# Patient Record
Sex: Male | Born: 1955
Health system: Southern US, Community
[De-identification: ages and names within clinical notes are randomized; demographics above are authoritative.]

## PROBLEM LIST (undated history)

## (undated) DIAGNOSIS — F329 Major depressive disorder, single episode, unspecified: Secondary | ICD-10-CM

## (undated) DIAGNOSIS — D72819 Decreased white blood cell count, unspecified: Secondary | ICD-10-CM

## (undated) DIAGNOSIS — R05 Cough: Secondary | ICD-10-CM

## (undated) DIAGNOSIS — E7800 Pure hypercholesterolemia, unspecified: Secondary | ICD-10-CM

## (undated) DIAGNOSIS — R9431 Abnormal electrocardiogram [ECG] [EKG]: Secondary | ICD-10-CM

## (undated) DIAGNOSIS — I10 Essential (primary) hypertension: Secondary | ICD-10-CM

## (undated) HISTORY — DX: Decreased white blood cell count, unspecified: D72.819

## (undated) HISTORY — PX: COLONOSCOPY: SHX174

## (undated) HISTORY — DX: Cough: R05

## (undated) HISTORY — DX: Abnormal electrocardiogram (ECG) (EKG): R94.31

## (undated) HISTORY — DX: Major depressive disorder, single episode, unspecified: F32.9

## (undated) HISTORY — DX: Essential (primary) hypertension: I10

## (undated) HISTORY — DX: Pure hypercholesterolemia, unspecified: E78.00

---

## 2001-10-04 HISTORY — PX: OTHER SURGICAL HISTORY: SHX169

## 2004-02-01 ENCOUNTER — Emergency Department (HOSPITAL_COMMUNITY): Admission: EM | Admit: 2004-02-01 | Discharge: 2004-02-02 | Payer: Self-pay | Admitting: Emergency Medicine

## 2004-02-02 ENCOUNTER — Ambulatory Visit: Payer: Self-pay | Admitting: Psychiatry

## 2004-02-02 ENCOUNTER — Inpatient Hospital Stay (HOSPITAL_COMMUNITY): Admission: EM | Admit: 2004-02-02 | Discharge: 2004-02-06 | Payer: Self-pay | Admitting: Psychiatry

## 2004-03-22 ENCOUNTER — Emergency Department (HOSPITAL_COMMUNITY): Admission: EM | Admit: 2004-03-22 | Discharge: 2004-03-22 | Payer: Self-pay | Admitting: Emergency Medicine

## 2004-03-22 ENCOUNTER — Inpatient Hospital Stay (HOSPITAL_COMMUNITY): Admission: RE | Admit: 2004-03-22 | Discharge: 2004-03-27 | Payer: Self-pay | Admitting: Psychiatry

## 2004-03-22 ENCOUNTER — Ambulatory Visit: Payer: Self-pay | Admitting: Psychiatry

## 2005-04-10 ENCOUNTER — Ambulatory Visit: Payer: Self-pay | Admitting: Endocrinology

## 2005-04-17 ENCOUNTER — Emergency Department (HOSPITAL_COMMUNITY): Admission: EM | Admit: 2005-04-17 | Discharge: 2005-04-17 | Payer: Self-pay | Admitting: Emergency Medicine

## 2005-04-17 ENCOUNTER — Inpatient Hospital Stay (HOSPITAL_COMMUNITY): Admission: RE | Admit: 2005-04-17 | Discharge: 2005-04-21 | Payer: Self-pay | Admitting: Psychiatry

## 2005-04-18 ENCOUNTER — Ambulatory Visit: Payer: Self-pay | Admitting: Psychiatry

## 2005-04-24 ENCOUNTER — Ambulatory Visit: Payer: Self-pay | Admitting: Endocrinology

## 2006-02-24 ENCOUNTER — Ambulatory Visit: Payer: Self-pay | Admitting: Endocrinology

## 2006-11-12 ENCOUNTER — Encounter: Payer: Self-pay | Admitting: Endocrinology

## 2006-11-12 DIAGNOSIS — F329 Major depressive disorder, single episode, unspecified: Secondary | ICD-10-CM

## 2006-11-12 DIAGNOSIS — I10 Essential (primary) hypertension: Secondary | ICD-10-CM

## 2006-11-12 DIAGNOSIS — F3289 Other specified depressive episodes: Secondary | ICD-10-CM | POA: Insufficient documentation

## 2006-11-12 HISTORY — DX: Major depressive disorder, single episode, unspecified: F32.9

## 2006-11-12 HISTORY — DX: Essential (primary) hypertension: I10

## 2008-03-06 ENCOUNTER — Ambulatory Visit: Payer: Self-pay | Admitting: Endocrinology

## 2008-03-06 LAB — CONVERTED CEMR LAB
ALT: 34 units/L (ref 0–53)
AST: 30 units/L (ref 0–37)
Alkaline Phosphatase: 60 units/L (ref 39–117)
Basophils Absolute: 0 10*3/uL (ref 0.0–0.1)
Bilirubin Urine: NEGATIVE
Bilirubin, Direct: 0.1 mg/dL (ref 0.0–0.3)
CO2: 30 meq/L (ref 19–32)
Calcium: 9.5 mg/dL (ref 8.4–10.5)
Chloride: 107 meq/L (ref 96–112)
Glucose, Bld: 101 mg/dL — ABNORMAL HIGH (ref 70–99)
HDL: 53.1 mg/dL (ref >39.0)
Hemoglobin: 13.3 g/dL (ref 13.0–17.0)
Ketones, ur: NEGATIVE mg/dL
LDL Cholesterol: 129 mg/dL — ABNORMAL HIGH (ref 0–99)
Leukocytes, UA: NEGATIVE
Lymphocytes Relative: 41.6 % (ref 12.0–46.0)
MCHC: 32.6 g/dL (ref 30.0–36.0)
Monocytes Relative: 10 % (ref 3.0–12.0)
Neutro Abs: 2.1 10*3/uL (ref 1.4–7.7)
Neutrophils Relative %: 45.6 % (ref 43.0–77.0)
Nitrite: NEGATIVE
Platelets: 257 10*3/uL (ref 150–400)
Potassium: 3.9 meq/L (ref 3.5–5.1)
RDW: 14.4 % (ref 11.5–14.6)
Sodium: 143 meq/L (ref 135–145)
TSH: 0.85 microintl units/mL (ref 0.35–5.50)
Total Bilirubin: 0.7 mg/dL (ref 0.3–1.2)
Total CHOL/HDL Ratio: 3.6
Total Protein: 7.5 g/dL (ref 6.0–8.3)
Urobilinogen, UA: 0.2 (ref 0.0–1.0)
pH: 6 (ref 5.0–8.0)

## 2008-03-10 ENCOUNTER — Ambulatory Visit: Payer: Self-pay | Admitting: Endocrinology

## 2008-03-10 DIAGNOSIS — R9431 Abnormal electrocardiogram [ECG] [EKG]: Secondary | ICD-10-CM | POA: Insufficient documentation

## 2008-03-10 DIAGNOSIS — E119 Type 2 diabetes mellitus without complications: Secondary | ICD-10-CM | POA: Insufficient documentation

## 2008-03-10 DIAGNOSIS — D72819 Decreased white blood cell count, unspecified: Secondary | ICD-10-CM | POA: Insufficient documentation

## 2008-03-10 DIAGNOSIS — E7800 Pure hypercholesterolemia, unspecified: Secondary | ICD-10-CM

## 2008-03-10 HISTORY — DX: Abnormal electrocardiogram (ECG) (EKG): R94.31

## 2008-03-10 HISTORY — DX: Pure hypercholesterolemia, unspecified: E78.00

## 2008-03-10 HISTORY — DX: Decreased white blood cell count, unspecified: D72.819

## 2008-03-15 ENCOUNTER — Ambulatory Visit: Payer: Self-pay

## 2008-03-15 ENCOUNTER — Encounter: Payer: Self-pay | Admitting: Endocrinology

## 2008-03-16 ENCOUNTER — Telehealth: Payer: Self-pay | Admitting: Endocrinology

## 2008-09-25 ENCOUNTER — Ambulatory Visit: Payer: Self-pay | Admitting: Endocrinology

## 2008-09-25 DIAGNOSIS — R059 Cough, unspecified: Secondary | ICD-10-CM | POA: Insufficient documentation

## 2008-09-25 DIAGNOSIS — R05 Cough: Secondary | ICD-10-CM

## 2008-09-25 HISTORY — DX: Cough, unspecified: R05.9

## 2009-09-04 ENCOUNTER — Emergency Department (HOSPITAL_COMMUNITY): Admission: EM | Admit: 2009-09-04 | Discharge: 2009-09-04 | Payer: Self-pay | Admitting: Family Medicine

## 2009-10-16 ENCOUNTER — Ambulatory Visit: Payer: Self-pay | Admitting: Endocrinology

## 2009-10-16 LAB — CONVERTED CEMR LAB
ALT: 33 units/L (ref 0–53)
AST: 31 units/L (ref 0–37)
BUN: 14 mg/dL (ref 6–23)
Basophils Relative: 0.8 % (ref 0.0–3.0)
Bilirubin, Direct: 0.1 mg/dL (ref 0.0–0.3)
Chloride: 104 meq/L (ref 96–112)
Eosinophils Relative: 1.6 % (ref 0.0–5.0)
GFR calc non Af Amer: 121.16 mL/min (ref >60)
HCT: 42.7 % (ref 39.0–52.0)
HDL: 44.6 mg/dL (ref >39.00)
Leukocytes, UA: NEGATIVE
Microalb Creat Ratio: 2.1 mg/g (ref 0.0–30.0)
Monocytes Relative: 9.2 % (ref 3.0–12.0)
Neutrophils Relative %: 56.1 % (ref 43.0–77.0)
Nitrite: NEGATIVE
Platelets: 304 10*3/uL (ref 150.0–400.0)
Potassium: 3.8 meq/L (ref 3.5–5.1)
RBC: 5.78 M/uL (ref 4.22–5.81)
Specific Gravity, Urine: 1.025 (ref 1.000–1.030)
Total Bilirubin: 0.5 mg/dL (ref 0.3–1.2)
Total CHOL/HDL Ratio: 5
Total Protein: 7.2 g/dL (ref 6.0–8.3)
VLDL: 41.8 mg/dL — ABNORMAL HIGH (ref 0.0–40.0)
WBC: 5.4 10*3/uL (ref 4.5–10.5)
pH: 6 (ref 5.0–8.0)

## 2009-11-15 ENCOUNTER — Ambulatory Visit: Payer: Self-pay | Admitting: Endocrinology

## 2009-11-15 ENCOUNTER — Encounter: Payer: Self-pay | Admitting: Endocrinology

## 2009-11-21 ENCOUNTER — Ambulatory Visit: Payer: Self-pay | Admitting: Cardiology

## 2010-05-07 NOTE — Assessment & Plan Note (Signed)
Summary: 1 MTH PHYSICAL---STC   Vital Signs:  Patient profile:   55 year old male Height:      66 inches (167.64 cm) Weight:      187.50 pounds (85.23 kg) BMI:     30.37 O2 Sat:      97 % on Room air Temp:     98.2 degrees F (36.78 degrees C) oral Pulse rate:   82 / minute BP sitting:   138 / 90  (left arm) Cuff size:   regular  Vitals Entered By: Brenton Grills MA (November 15, 2009 9:38 AM)  O2 Flow:  Room air CC: Physical/pt is not currently taking Pravachol or using Lotrisone cream/aj Is Patient Diabetic? Yes   CC:  Physical/pt is not currently taking Pravachol or using Lotrisone cream/aj.  History of Present Illness: here for regular wellness examination.  He's feeling pretty well in general, and does not drink or smoke.   Current Medications (verified): 1)  Pravachol 40 Mg Tabs (Pravastatin Sodium) .... 2 Tabs At Bedtime 2)  Lotrisone 1-0.05 % Crea (Clotrimazole-Betamethasone) .... Three Times A Day As Needed Rash 3)  Losartan Potassium-Hctz 100-12.5 Mg Tabs (Losartan Potassium-Hctz) .Marland Kitchen.. 1 Once Daily  Allergies (verified): 1)  ! Zestril  Family History: Reviewed history from 09/25/2008 and no changes required. 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         denies decreased urinary stream.  Physical Exam  General:  normal appearance.   Head:  head: no deformity eyes: no periorbital swelling, no proptosis external nose and ears are normal mouth: no lesion seen Neck:  Supple without thyroid enlargement or tenderness.  Lungs:  Clear  to auscultation bilaterally. Normal respiratory effort.  Abdomen:  abdomen is soft, nontender.  no hepatosplenomegaly.   not distended.  no hernia  Rectal:  normal external and internal exam.  heme neg  Prostate:  Normal size prostate without masses or tenderness.  Msk:  muscle bulk and strength are grossly normal.  no obvious joint swelling.  gait is normal and steady  Extremities:  no deformity.  no ulcer on the feet.  feet are of normal color and temp.   mild heat rash on the feet trace right pedal edema and trace left pedal edema.   Neurologic:  cn 2-12 grossly intact.   readily moves all 4's.   sensation is intact to touch on the feet  Skin:  normal texture and temp.  no rash.  not diaphoretic  Cervical Nodes:  No significant adenopathy.  Psych:  Alert and cooperative; normal mood and affect; normal attention span and concentration.   Additional Exam:  SEPARATE EVALUATION FOLLOWS--EACH PROBLEM HERE IS NEW, NOT RESPONDING TO TREATMENT, OR POSES SIGNIFICANT RISK TO THE PATIENT'S HEALTH: HISTORY OF THE PRESENT ILLNESS: ecg is more abnormal than in 2009.  no chest pain elev ldl and a1c are noted.  no weight loss PAST MEDICAL HISTORY reviewed and up to date today REVIEW OF SYSTEMS: no sob.  he reports weight gain PHYSICAL EXAMINATION: see vs page cv: reg rate and rhythm.  no murmur dorsalis pedis intact bilat.  no carotid bruit LAB/XRAY RESULTS: a1c and ldl are  reviewed ecg reviewed IMPRESSION: dm, needs increased rx dyslipidemia, needs increased rx abnormal ecg PLAN: see instruction sheet   Impression & Recommendations:  Problem # 1:  ROUTINE GENERAL MEDICAL EXAM@HEALTH  CARE FACL (ICD-V70.0)  Medications Added to Medication List This Visit: 1)  Metformin Hcl 500 Mg Xr24h-tab (Metformin hcl) .Marland Kitchen.. 1 tab each am  Other Orders: EKG w/ Interpretation (93000) Cardiology Referral (Cardiology) Est. Patient Level III (02725) Est. Patient 40-64 years (36644)  Patient  Instructions: 1)  refer cardiology.  you will be called with a day and time for an appointment. 2)  resume pravastatin 2x40 mg at bedtime. 3)  metformin 500 mg each am. 4)  good diet and exercise habits significanly improve the control of your diabetes.  please let me know if you wish to be referred to a dietician.  high blood sugar is very risky to your health.  you should see an eye doctor every year. 5)  controlling your blood pressure and cholesterol drastically reduces the damage diabetes does to your body.  this also applies to quitting smoking.  please discuss these with your doctor.  you should take an aspirin every day, unless you have been advised by a doctor not to. 6)  please consider these measures for your health:  minimize alcohol.  do not use tobacco products.  have a colonoscopy at least every 10 years from age 25.  keep firearms safely stored.  always use seat belts.  have working smoke alarms in your home.  see an eye doctor and dentist regularly.  never drive under the influence of alcohol or drugs (including prescription drugs).  7)  Please schedule a follow-up appointment in 6 months. Prescriptions: METFORMIN HCL 500 MG XR24H-TAB (METFORMIN HCL) 1 tab each am  #90 x 3   Entered and Authorized by:   Minus Breeding MD   Signed by:   Minus Breeding MD on 11/15/2009   Method used:   Electronically to        [facility] (retail)       [address]       [place], [place]  [id number]       Ph: [phone]       Fax: [phone]   RxID:   [id number] LOTRISONE 1-0.05 % CREA (CLOTRIMAZOLE-BETAMETHASONE) three times a day as needed rash  #1 med tube x 2   Entered and Authorized by:   Minus Breeding MD   Signed by:   Minus Breeding MD on 11/15/2009   Method used:   Electronically to        [facility] (retail)       [address]       [place], [place]  [id number]       Ph: [phone]       Fax: [phone]   RxID:    [id number] PRAVACHOL 40 MG TABS (PRAVASTATIN SODIUM) 2 tabs at bedtime  #180 x 3   Entered and Authorized by:   Minus Breeding MD   Signed by:   Minus Breeding MD on 11/15/2009   Method used:   Electronically to        [facility] (retail)       [address]       [place], [place]  [id number]       Ph: [phone]       Fax: [phone]   RxID:   [id number]

## 2010-05-07 NOTE — Assessment & Plan Note (Signed)
Summary: np6/abn ekg/jml   Visit Type:  new pt visit [address] [facility]:  Minus Breeding MD  CC:  pt here for Abnormal EKG as per Everardo All w/[place]...denies any cardiac complaints today.  History of Present Illness: 55 yo with history of diabetes, hyperlipidemia, and HTN presents for cardiac risk evaluation.  He has been doing well symptomatically with no chest pain/tightness or exertional dyspnea.  He is not very active.  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 He has been taking metformin for diabetes with stable fasting glucose.  LDL was 140 on recent BMET, but he had been taking his pravastatin only rarely.  He is taking it every night now.  His ECG shows LVH with repolarization abnormality.  BP is under good control today at 118/80.  He is very concerned about medical expenses as he has to pay out of pocket.   ECG: NSR, LVH with repolarization abnormality  Labs (7/11): LDL 140, HDL 45, TGs 209, K 3.8, creatinine 0.9, TSH normal  Current Medications (verified): 1)  Pravachol 40 Mg Tabs (Pravastatin Sodium) .... 2 Tabs At Bedtime 2)  Lotrisone 1-0.05 % Crea (Clotrimazole-Betamethasone) .... Three Times A Day As Needed Rash 3)  Losartan Potassium-Hctz 100-12.5 Mg Tabs (Losartan Potassium-Hctz) .Marland Kitchen.. 1 Once Daily 4)  Metformin Hcl 500 Mg Xr24h-Tab (Metformin Hcl) .Marland Kitchen.. 1 Tab Each Am  Allergies: 1)  ! Zestril  Past History:  Past Medical History: 1. Depression 2. Hypertension: Echo (12/09) with EF 65%, no valvular abnormalities 3. Dyslipidemia 4. Diabetes  Family History: Reviewed history from 09/25/2008 and no changes required. 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   Vital Signs:  Patient profile:   55 year old male Height:       66 inches Weight:      190 pounds Pulse rate:   98 / minute Pulse rhythm:   irregular BP sitting:   118 / 80  (left arm) Cuff size:   large  Vitals Entered By: Danielle Rankin, CMA (November 21, 2009 2:45 PM)  Physical Exam  General:  Well developed, well nourished, in no acute distress. Head:  normocephalic and atraumatic Nose:  no deformity, discharge, inflammation, or lesions Mouth:  Teeth, gums and palate normal. Oral mucosa normal. Neck:  Neck supple, no JVD. No masses, thyromegaly or abnormal cervical nodes. Lungs:  Clear bilaterally to auscultation and percussion. Heart:  Non-displaced PMI, chest non-tender; regular rate and rhythm, S1, S2 without murmurs, rubs. +S4. Carotid upstroke normal, no bruit. Pedals normal pulses. No edema, no varicosities. Abdomen:  Bowel sounds positive; abdomen soft and non-tender without masses, organomegaly, or hernias noted. No hepatosplenomegaly. Msk:  Back normal, normal gait. Muscle strength and tone normal. Extremities:  No clubbing or cyanosis. Neurologic:  Alert and oriented x 3. Skin:  Intact without lesions or rashes. Psych:  Normal affect.   Impression & Recommendations:  Problem # 1:  HYPERTENSION (ICD-401.9) ECG shows LVH with repolarization.  However, BP is well-controlled on current regimen.  Would continue.  Echo in 2009 showed normal LV systolic function and minimal LVH. Will not repeat as patient would have to pay out of pocket.   Problem # 2:  HYPERCHOLESTEROLEMIA (ICD-272.0) Will need repeat lipids/LFTs taking pravastatin regularly.  Goal  LDL at least < 100 with diabetes history.   Problem # 3:  CV RISK ASSESSMENT Patient has a number of risk factors for coronary disease, including DM, HTN, and hyperlipidemia.  Would continue aggressive risk factor management, as is being done.  ECG shows LVH with secondary repolarization abnormality, likely related to his HTN.  No further diagnostic evaluation necessary at this.  As above,  would keep SBP less than 130 and LDL at least < 100.   Patient Instructions: 1)  Your physician has recommended you make the following change in your medication:  2)  Taker Aspirin 81mg  daily--this should be buffered or coated. 3)  Your physician recommends that you schedule a follow-up appointment as needed with Dr Shirlee Latch.

## 2010-05-07 NOTE — Assessment & Plan Note (Signed)
Summary: bp meds/cd   Vital Signs:  Patient profile:   55 year old male Height:      66 inches (167.64 cm) Weight:      186 pounds (84.55 kg) BMI:     30.13 O2 Sat:      96 % on Room air Temp:     97.6 degrees F (36.44 degrees C) oral Pulse rate:   99 / minute BP sitting:   130 / 80  (left arm) Cuff size:   regular  Vitals Entered By: Brenton Grills MA (October 16, 2009 2:54 PM)  O2 Flow:  Room air CC: discuss BP meds/pt states he is not currently taking Pravachol and is no longer using the Lotrisone cream/aj   CC:  discuss BP meds/pt states he is not currently taking Pravachol and is no longer using the Lotrisone cream/aj.  History of Present Illness: dm:  pt says his bp is elevated (150/-).  he increased the hyzaar, with improvement in his bp.  pt states he feels well in general.   dyslipidemia:  he is not talking pravachol. dm:  pt c/o weight gain  Allergies: 1)  ! 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   Lungs:  Clear to auscultation bilaterally. Normal respiratory effort.  Heart:  Regular rate and rhythm without murmurs or gallops noted. Normal S1,S2.   Additional Exam:  Hemoglobin A1C            6.5 %   Impression & Recommendations:  Problem # 1:  HYPERCHOLESTEROLEMIA (ICD-272.0) needs increased rx  Problem # 2:  HYPERTENSION (ICD-401.9) needs increased rx  Problem # 3:  DM (ICD-250.00) rx complicated by weight gain  Medications Added to Medication List This Visit: 1)  Pravachol 40 Mg Tabs (Pravastatin sodium) .... 2 tabs at bedtime 2)  Losartan Potassium-hctz 100-12.5 Mg Tabs (Losartan potassium-hctz) .Marland Kitchen..  1 once daily  Other Orders: TLB-Lipid Panel (80061-LIPID) TLB-BMP (Basic Metabolic Panel-BMET) (80048-METABOL) TLB-CBC Platelet - w/Differential (85025-CBCD) TLB-Hepatic/Liver Function Pnl (80076-HEPATIC) TLB-TSH (Thyroid Stimulating Hormone) (84443-TSH) TLB-A1C / Hgb A1C (Glycohemoglobin) (83036-A1C) TLB-Microalbumin/Creat Ratio, Urine (82043-MALB) TLB-Udip w/ Micro (81001-URINE) Est. Patient Level IV (04540)  Patient Instructions: 1)  increase hyzaar to 100/12.5, 1/day 2)  physical in 1 month. 3)  blood tests are being ordered for you today.  please call [phone] to hear your test results. 4)  (update: i left message on phone-tree:  resume pravachol at 80 mg at bedtime.  weight loss is advised). Prescriptions: PRAVACHOL 40 MG TABS (PRAVASTATIN SODIUM) 2 tabs at bedtime  #60 x 11   Entered and Authorized by:   Minus Breeding MD   Signed by:   Minus Breeding MD on 10/17/2009   Method used:   Electronically to        [id number] (retail)       [address]       [place], [place]  [address]       Ph: [phone] or [phone]       Fax: [phone]   RxID:   [id number] LOSARTAN POTASSIUM-HCTZ 100-12.5 MG TABS (LOSARTAN POTASSIUM-HCTZ) 1 once daily  #30 x  11   Entered and Authorized by:   Minus Breeding MD   Signed by:   Minus Breeding MD on 10/16/2009   Method used:   [facility] to        [id number]       [address]       [place], [place]  [address]       Ph: [phone] or [phone]       Fax: [phone]   RxID:   [id number]

## 2010-08-23 NOTE — H&P (Signed)
[name], [name]              ACCOUNT NO.:  [id number]   MEDICAL RECORD NO.:  [id number]          PATIENT TYPE:  IPS   LOCATION:  0306                          FACILITY:  [facility]   PHYSICIAN:  Syed T. Arfeen, M.D.   DATE OF BIRTH:  [birth year]   DATE OF ADMISSION:  03/22/2004  DATE OF DISCHARGE:                         PSYCHIATRIC ADMISSION ASSESSMENT   IDENTIFYING INFORMATION:  This is a 55 year old married African-American  male who was voluntarily admitted on March 22, 2004.   HISTORY OF PRESENT ILLNESS:  The patient presents with therapy history of  depression, having suicide ideation with no specific plans.  The patient  relapsed and has been drinking beer and doing cocaine.  The patient was  recently discharged from [facility] about a month ago for  similar complaints of depression, drug and alcohol use, some apparent  noncompliance with medication.   PAST PSYCHIATRIC HISTORY:  The patient was here in November of 2005 for  depression and cocaine abuse.   SOCIAL HISTORY:  He is a 55 year old married African-American male, had s 1-  year-old son.  The patient at this time is unable to provide any other  social history.  He is quite sleepy.   FAMILY HISTORY:  Unknown.   ALCOHOL DRUG HISTORY:  The patient reports some recent alcohol and cocaine  use.   PAST MEDICAL HISTORY:  Primary care [name] is unknown.  Medical problems  are hypertension.   MEDICATIONS:  Symmetrel 100 mg b.i.d., Hyzaar 50/12.5, Ambien 10 mg q.h.s.  and Prozac 20 mg daily.   DRUG ALLERGIES:  No known allergies.   PHYSICAL EXAMINATION:  The patient was assessed at [facility].  He is at this time in the bed, no acute distress, very sleepy.  Temperature is 97.4, 88 heart rate, 20 respirations, blood pressure 130/94,  151 pounds, 5 feet 6 inches tall.   LABORATORY DATA:  Potassium was 3.4, alcohol was 19, urine drug screen was  positive for cocaine.   MENTAL  STATUS EXAM:  The patient is in the bed, very sleepy, trying to be  cooperative but he is having difficulty keeping his eyes open.  Speech is  clear when he does respond.  The patient is feeling very guilty.  Affect  again, the patient is feeling very sleepy.  Thought processes are brief  answers, what the patient provides are coherent.   ADMISSION DIAGNOSES:   AXIS I:  1.  Major depressive disorder.  2.  Rule out substance-induced mood disorder.  3.  Cocaine abuse.   AXIS II:  Deferred.   AXIS III:  Hypertension.   AXIS IV:  Other psychosocial problems related to drug and alcohol use,  potential problems with primary support group.   AXIS V:  Current is 35, past year is 55.   PLAN:  Admission for depression, suicide ideation, drug use.  Contract for  safety, stabilize mood and thinking.  We will resume his medications.  May  increase the patient's Prozac, work on relapse prevention.  The patient is  to attend all individual and group therapy, consider the CDIOP  program.  The  patient is to attend NA and AA meetings for continued support.   TENTATIVE LENGTH OF CARE:  4-5 days.     Jani   JO/MEDQ  D:  03/23/2004  T:  03/24/2004  Job:  [id number]

## 2010-08-23 NOTE — Discharge Summary (Signed)
NAMECOSME, [name]              ACCOUNT NO.:  [id number]   MEDICAL RECORD NO.:  [id number]          PATIENT TYPE:  IPS   LOCATION:  0305                          FACILITY:  [facility]   PHYSICIAN:  Syed T. Arfeen, M.D.   DATE OF BIRTH:  31-Mar-1956   DATE OF ADMISSION:  03/22/2004  DATE OF DISCHARGE:  03/27/2004                                 DISCHARGE SUMMARY   IDENTIFYING INFORMATION:  The patient is a 55 year old, married, African-  American who was voluntarily admitted on December 16th.   HISTORY OF PRESENT ILLNESS:  Patient presented with a history of depression,  having suicidal thoughts; however, no specific plan.  Patient relapsed and  has been drinking beer and doing cocaine.  Patient was recently discharged  from [facility] about a month ago for similar complaints of  depression, drug and alcohol use; however, has been noncompliant with his  medication.   PAST PSYCHIATRIC HISTORY:  Patient was here in November of 2005 for  depression and cocaine abuse.   MEDICATIONS:  1.  Symmetrel 100 mg twice a day.  2.  Hyzaar 50/12.5.  3.  Ambien 10 mg q.h.s.  4.  Prozac 20 mg daily.   DRUG ALLERGIES:  No known drug allergies.   PAST MEDICAL HISTORY:  No primary care Erinn Huskins.  However, history of  hypertension.   PHYSICAL EXAMINATION:  Physical examination was done at [facility]; essentially within normal limits.   LABORATORY DATA:  Potassium was 3.4, alcohol was 19.  Urine drug screen was  positive for cocaine.   MENTAL STATUS EXAM:  Patient was lying in the bed, very sleepy, trying to be  cooperative, but having difficulty keeping his eyes open.  His speech was  slow, clear, but coherent.  Patient appears very depressed, isolated and  withdrawn.  Feels very guilty about his relapse.  Mood sad.  Affect  dysphoric.  Thought processes were logical and goal-directed, but appears  very sedated.  Denies any auditory hallucinations or homicidal ideation,  but  endorses to vague suicidal thoughts.  Insight and judgment limited.   ADMITTING DIAGNOSIS:   AXIS I:  1.  Major depressive disorder.  2.  Rule out substance-induced mood disorder.  3.  Cocaine abuse.  4.  Alcohol abuse.   AXIS II:  Deferred.   AXIS III:  Hypertension, as per history.   AXIS IV:  Moderate.   AXIS V:  35.   HOSPITAL COURSE:  The patient was admitted and ordered routine p.r.n. and  standing medication.  He was placed for safety checks.  His medications were  resumed and he was closely monitored for any substance abuse withdrawal,  intoxication and started on Librium 2 mg p.o. q.6 p.r.n. for any possible  withdrawals.  He was restarted on his medication, Prozac, which was  gradually increased from 30 mg.  He was encouraged to participate in  individual, group and milieu therapies.  He was also started on Symmetrel  100 mg twice a day and losartan 50 mg for his blood pressure and  hydrochlorothiazide 12.5 every day antihypertensive medicine.  Patient  continues to endorse some depression and passive suicidal thoughts.  His  Prozac was increased to 40 mg a day, which patient tolerated very well and  started to see some positive improvements.  No shakes, tremors or any  withdrawal symptom was noted.  He was encouraged to verbalize in the group.  A family session was requested and done with the wife, in which patient was  able to address about his issues and admitted that he was being noncompliant  with his medications.  The wife appears to be very supportive, but very  concerned about his noncompliance with his medications.  Patient was able to  address this issue and agreed to comply with the aftercare and discharge  plan.  He started showing some improvement, seemed more verbal, active and  social in the groups, able to participate in the groups.  He reported a  positive response to medication intervention and increased Prozac.  Discharge planning was  discussed and patient appears to be very motivated  and excited about his follow-up and aftercare plan.  He endorses that he  would like to go back to his wife and agreed that he would be compliant with  the medication.   CONDITION ON DISCHARGE:  Remarkably improved.  Mood euthymic.  Affect  Yam.  Thought processes logical, goal-directed.  Thought content negative  for any dangerousness or suicidal ideation.  No paranoia, delusion elicited.  Denies any auditory hallucination, suicidal ideation or homicidal ideation.  Seen with increased coping and social skills.  Reported a positive response  to medication intervention.   DISCHARGE MEDICATIONS:  1.  Amantadine 100 mg twice a day.  2.  Prozac 40 mg once a day.  3.  Losartan 50 mg every day.  4.  Hydrochlorothiazide 12.5 mg every day.   DISPOSITION:  Patient was discharged with a follow-up to see Dr. Tomasa Rand  on Thursday, January 5th, at 10:45 a.m.  He was given an appointment for  both one-to-one and individual therapies and medication management.  Patient  was told to see Tenna Child on January 6th on Friday at 10:15 a.m.,  address is [facility], [address] [place].   DISCHARGE DIAGNOSES:   AXIS I:  1.  Depressive disorder, not otherwise specified.  2.  Alcohol abuse.  3.  Cocaine abuse.   AXIS II:  Deferred.   AXIS III:  Hypertension.   AXIS IV:  Moderate.   AXIS V:  70Kathi Ludwig   STA/MEDQ  D:  04/12/2004  T:  04/12/2004  Job:  [id number]

## 2010-08-23 NOTE — Discharge Summary (Signed)
[name], [name]              ACCOUNT NO.:  [id number]   MEDICAL RECORD NO.:  [id number]          PATIENT TYPE:  IPS   LOCATION:  0508                          FACILITY:  [facility]   PHYSICIAN:  Geoffery Lyons, M.D.      DATE OF BIRTH:  09-Apr-1955   DATE OF ADMISSION:  04/17/2005  DATE OF DISCHARGE:  04/21/2005                                 DISCHARGE SUMMARY   CHIEF COMPLAINT AND PRESENTING ILLNESS:  This was the third admission to  [facility]  for this 55 year old African-American male,  married, voluntarily admitted.  History of depression, he stopped his Prozac  when he was doing well, got more and more depressed over the past 2 months  prior to this admission.  He developed suicidal thoughts, wanting to walk in  front of a train.  He was denying any auditory or visual hallucinations.  Decreased sleep, relapsed on cocaine and he was drinking at least 6 beers  per day.  Became hopeless, helpless, suicidal.  After he realized he had  relapsed, he requested help.   PAST PSYCHIATRIC HISTORY:  Third time at [facility], no active  treatment, prior suicidal ideas without attempt.   ALCOHOL AND DRUG HISTORY:  As already stated, history of cocaine and alcohol  abuse.   PAST MEDICAL HISTORY:  Arterial hypertension.   MEDICATIONS:  Hyzaar 25/100 mg per day.   PHYSICAL EXAMINATION:  Performed, failed to show any acute findings.   LABORATORY WORKUP:  Blood chemistries, glucose 116.  Liver enzymes SGOT 22,  SGPT 17, total bilirubin 0.5.  TSH 0.315.   MENTAL STATUS EXAM:  Reveals a fully alert male, somewhat irritable, guarded  affect, cooperative.  Speech soft tone, barely audible, some psychomotor  retardation.  Mood depressed, irritable, affect depressed.  Thought process  slow production, positive for suicidal ideation with a plan to walk in front  of a car, but can contract for safety.  No delusions, no hallucinations.  Cognition well preserved.   ADMISSION  DIAGNOSES:  AXIS I:  Major depression, recurrent, alcohol and  cocaine abuse.  AXIS II:  No diagnosis.  AXIS III:  No diagnosis.  AXIS IV:  Moderate.  AXIS V:  Upon admission 28, highest global assessment of functioning in the  last year 70.   COURSE IN HOSPITAL:  He  was admitted.  He was started in individual and  group psychotherapy.  He was detoxified, was placed back on Prozac.  He  admitted that he relapsed on cocaine, very upset, ashamed for the relapse.  He still has his job as a Optician, dispensing.  He was wanting to ask for a leave so he  could take care of this situation.  Did endorse that he had quit his  antidepressant because he was feeling very well.  Endorsed some difficulty  with his wife, endorsed increased stress, got to use cocaine after he got  depressed.  Increased depression he feels led to cocaine.  There were some  cravings.  He worked on a relapse prevention plan.  The wife was still very  supportive.  On  January 15 he was in full contact with reality, back on  Prozac, tolerating the medication well.  No suicidal or homicidal ideas, no  withdrawal, no cravings, willing and motivated to pursue further outpatient  treatment, therefore we discharged to outpatient followup.   DISCHARGE DIAGNOSES:  AXIS I:  Major depression, recurrent, alcohol and  cocaine abuse.  AXIS II:  No diagnosis.  AXIS III:  No diagnosis.  AXIS IV:  Moderate.  AXIS V:  Upon discharge 50.   DISCHARGE MEDICATIONS:  1.  Prozac 20 mg per day.  2.  Symmetrel 100 twice per day.  3.  Trazodone 50 at night for sleep.   FOLLOWUP:  Follow up Dr. Electa Sniff at [facility] outpatient  clinic.      Geoffery Lyons, M.D.  Electronically Signed     IL/MEDQ  D:  04/29/2005  T:  04/30/2005  Job:  [id number]

## 2010-08-23 NOTE — Discharge Summary (Signed)
NAMEPERSEUS, WESTALL              ACCOUNT NO.:  [id number]   MEDICAL RECORD NO.:  [id number]          PATIENT TYPE:  IPS   LOCATION:  0507                          FACILITY:  [facility]   PHYSICIAN:  Geoffery Lyons, M.D.      DATE OF BIRTH:  [birth year]   DATE OF ADMISSION:  02/02/2004  DATE OF DISCHARGE:  02/06/2004                                 DISCHARGE SUMMARY   CHIEF COMPLAINT AND PRESENTING ILLNESS:  This was the first admission to  [facility]  for this 55 year old African-American male,  married, voluntarily admitted.  Endorsed being depressed, hopeless, because  the wife of 10 years wants to leave him and take the 85-year-old son to  [place].  She keeps talking about this, this is a source of friction.  He  endorsed that he thought the depression was a factor.  Endorsed being slowed  down, decreased concentration, feeling reclusive, sad.  He wanted to find a  gun and shoot himself.   PAST PSYCHIATRIC HISTORY:  History of prior depression, treated with Prozac  successfully.   ALCOHOL AND DRUG HISTORY:  History of alcohol use, but denies addiction.  Drug screen positive for cocaine.   PAST MEDICAL HISTORY:  Arterial hypertension.   MEDICATIONS:  Prozac 20 mg per day, started a couple of months ago.   PHYSICAL EXAMINATION:  Performed, failed to show any acute findings.   LABORATORY WORKUP:  Blood chemistries:  Potassium 2.9 replaced to 4.0,  glucose 88.  Liver profile within normal limits.  TSH 0.502.  Urinalysis was  within normal limits.   MENTAL STATUS EXAM:  Reveals a fully alert, pleasant, cooperative male.  Mood depressed, affect depressed, tearful.  Speech normal rate, tempo and  production.  Thought process logical, coherent and relevant.  Endorsed  suicide ideation, thoughts of wanting to shoot himself. Cognition well  preserved.   ADMISSION DIAGNOSES:   AXIS I:  1.  Major depression, recurrent.  2.  Cocaine abuse.   AXIS II:  No diagnosis.   AXIS III:  Hypokalemia, corrected, arterial hypertension.   AXIS IV:  Moderate.   AXIS V:  Global assessment of function upon admission 25, highest global  assessment of function in past year 68.   COURSE IN HOSPITAL:  He was admitted and started on individual and group  psychotherapy.  He was given Ambien for sleep.  He was given Symmetrel 100  twice a day, restarted on Prozac 20 mg per day, given Ativan as needed for  anxiety, Hyzaar 50/12.5 mg daily, K-Dur 20 mEq at the time of the assessment  and 20 mEq daily, Clonidine as needed for the blood pressure.  He endorsed  stress secondary to conflict with his wife, endorsed he started using  cocaine, can not justify why he went back to using it, was feeling  depressed.  Apparently had a previous bout with depression years ago but  endorsed this was the worst.  Constant thoughts of how to kill himself.  He  received communication with his wife.  Apparently the feeling was that they  were going to be  able to work on their issues.  There was some conflict in  the way she was addressing her family of origin needs.  They were able to  start talking about these things.  They were wanting to pursue outpatient  treatment.  He continued to improve.  By November 1 he was in full contact  with reality.  There were no suicidal ideas, no homicidal ideas, no  hallucinations, no delusions.  Feeling much better.  Felt that he had been  able to address the issues with his wife successfully.  Endorsed no suicidal  ideas, no homicidal ideas, was ready to pursue his outside responsibilities.   DISCHARGE DIAGNOSES:   AXIS I:  1.  Major depression, recurrent.  2.  Cocaine abuse.   AXIS II:  No diagnosis.   AXIS III:  Arterial hypertension, hypokalemia corrected.   AXIS IV:  Moderate.   AXIS V:  Global assessment of function upon discharge 65.   DISCHARGE MEDICATIONS:  1.  Symmetrel 100 twice a day.  2.  Prozac 20 mg per day.  3.  Hyzaar 50/12.5  daily.  4.  Ambien 10 at bedtime for sleep.   DISPOSITION:  Follow up [facility] starting November 2.     Farrel Gordon   IL/MEDQ  D:  03/01/2004  T:  03/01/2004  Job:  [id number]

## 2010-10-04 ENCOUNTER — Telehealth: Payer: Self-pay | Admitting: *Deleted

## 2010-10-04 NOTE — Telephone Encounter (Signed)
Per MD, pt is due for F/U OV. Left detailed message for pt to callback office to schedule F/U OV.

## 2010-11-21 ENCOUNTER — Other Ambulatory Visit: Payer: Self-pay | Admitting: Endocrinology

## 2010-12-10 ENCOUNTER — Encounter: Payer: Self-pay | Admitting: Endocrinology

## 2010-12-10 ENCOUNTER — Ambulatory Visit (INDEPENDENT_AMBULATORY_CARE_PROVIDER_SITE_OTHER): Payer: Commercial Managed Care - PPO | Admitting: Endocrinology

## 2010-12-10 ENCOUNTER — Other Ambulatory Visit (INDEPENDENT_AMBULATORY_CARE_PROVIDER_SITE_OTHER): Payer: Commercial Managed Care - PPO

## 2010-12-10 ENCOUNTER — Other Ambulatory Visit: Payer: Self-pay | Admitting: Endocrinology

## 2010-12-10 VITALS — BP 132/90 | HR 72 | Temp 98.3°F | Ht 67.0 in | Wt 184.8 lb

## 2010-12-10 DIAGNOSIS — E7800 Pure hypercholesterolemia, unspecified: Secondary | ICD-10-CM

## 2010-12-10 DIAGNOSIS — Z125 Encounter for screening for malignant neoplasm of prostate: Secondary | ICD-10-CM

## 2010-12-10 DIAGNOSIS — F329 Major depressive disorder, single episode, unspecified: Secondary | ICD-10-CM

## 2010-12-10 DIAGNOSIS — F3289 Other specified depressive episodes: Secondary | ICD-10-CM

## 2010-12-10 DIAGNOSIS — Z23 Encounter for immunization: Secondary | ICD-10-CM

## 2010-12-10 DIAGNOSIS — D72819 Decreased white blood cell count, unspecified: Secondary | ICD-10-CM

## 2010-12-10 DIAGNOSIS — R972 Elevated prostate specific antigen [PSA]: Secondary | ICD-10-CM

## 2010-12-10 DIAGNOSIS — Z0000 Encounter for general adult medical examination without abnormal findings: Secondary | ICD-10-CM

## 2010-12-10 DIAGNOSIS — E119 Type 2 diabetes mellitus without complications: Secondary | ICD-10-CM

## 2010-12-10 DIAGNOSIS — Z79899 Other long term (current) drug therapy: Secondary | ICD-10-CM

## 2010-12-10 DIAGNOSIS — I10 Essential (primary) hypertension: Secondary | ICD-10-CM

## 2010-12-10 LAB — MICROALBUMIN / CREATININE URINE RATIO
Creatinine,U: 169.7 mg/dL
Microalb, Ur: 1.8 mg/dL (ref 0.0–1.9)

## 2010-12-10 LAB — LIPID PANEL
Cholesterol: 204 mg/dL — ABNORMAL HIGH (ref 0–200)
HDL: 49.7 mg/dL (ref >39.00)
Triglycerides: 94 mg/dL (ref 0.0–149.0)

## 2010-12-10 LAB — CBC WITH DIFFERENTIAL/PLATELET
Basophils Absolute: 0.1 10*3/uL (ref 0.0–0.1)
Eosinophils Absolute: 0.1 10*3/uL (ref 0.0–0.7)
Lymphocytes Relative: 38.3 % (ref 12.0–46.0)
MCHC: 31.4 g/dL (ref 30.0–36.0)
MCV: 75 fl — ABNORMAL LOW (ref 78.0–100.0)
Monocytes Absolute: 0.6 10*3/uL (ref 0.1–1.0)
Neutrophils Relative %: 48.9 % (ref 43.0–77.0)
Platelets: 309 10*3/uL (ref 150.0–400.0)
RBC: 5.69 Mil/uL (ref 4.22–5.81)

## 2010-12-10 LAB — URINALYSIS, ROUTINE W REFLEX MICROSCOPIC
Bilirubin Urine: NEGATIVE
Hgb urine dipstick: NEGATIVE
Ketones, ur: NEGATIVE
Nitrite: NEGATIVE
Total Protein, Urine: NEGATIVE
Urine Glucose: NEGATIVE
pH: 6.5 (ref 5.0–8.0)

## 2010-12-10 LAB — HEMOGLOBIN A1C: Hgb A1c MFr Bld: 6.4 % (ref 4.6–6.5)

## 2010-12-10 LAB — HEPATIC FUNCTION PANEL
ALT: 22 U/L (ref 0–53)
Albumin: 4.3 g/dL (ref 3.5–5.2)
Alkaline Phosphatase: 61 U/L (ref 39–117)
Bilirubin, Direct: 0.1 mg/dL (ref 0.0–0.3)
Total Protein: 7.5 g/dL (ref 6.0–8.3)

## 2010-12-10 LAB — BASIC METABOLIC PANEL
BUN: 15 mg/dL (ref 6–23)
GFR: 114.4 mL/min (ref >60.00)
Potassium: 3.6 mEq/L (ref 3.5–5.1)

## 2010-12-10 NOTE — Patient Instructions (Addendum)
blood tests are being requested for you today.  please call [phone] to hear your test results.  You will be prompted to enter the 9-digit "MRN" number that appears at the top left of this page, followed by #.  Then you will hear the message. Based on the results, i may advise you to increase the metformin. please consider these measures for your health:  minimize alcohol.  do not use tobacco products.  have a colonoscopy at least every 10 years from age 70.   keep firearms safely stored.  always use seat belts.  have working smoke alarms in your home.  see an eye doctor and dentist regularly.  never drive under the influence of alcohol or drugs (including prescription drugs).   please let me know what your wishes would be, if artificial life support measures should become necessary.  it is critically important to prevent falling down (keep floor areas well-lit, dry, and free of loose objects). Please make a follow-up appointment in 6 months. It is very important to get a colonoscopy, as this reduces your chances of dying of colon cancer.  Please call if you want to schedule this. (update: i left message on phone-tree:  Ref urol.  Increase metformin to 2x500/d).

## 2010-12-10 NOTE — Progress Notes (Signed)
Subjective:    Patient ID: [name], male    DOB: [birth year], 55 y.o.   MRN: [id number]  HPI here for regular wellness examination.  He's feeling pretty well in general, and says chronic med probs are stable, except as noted below Past Medical History  Diagnosis Date  . DM 03/10/2008  . HYPERCHOLESTEROLEMIA 03/10/2008  . LEUKOPENIA, CHRONIC 03/10/2008  . DEPRESSION 11/12/2006  . HYPERTENSION 11/12/2006    Echo (12/09) with EF 65%, no valvular abnormalities  . COUGH DUE TO ACE INHIBITORS 09/25/2008  . ELECTROCARDIOGRAM, ABNORMAL 03/10/2008    Past Surgical History  Procedure Date  . Stress cardiolite 10/04/2001    History   Social History  . Marital Status: Married    Spouse Name: N/A    Number of Children: N/A  . Years of Education: N/A   Occupational History  . [employer]    Social History Main Topics  . Smoking status: Former Smoker    Quit date: 04/07/2005  . Smokeless tobacco: Not on file  . Alcohol Use:   . Drug Use:   . Sexually Active:    Other Topics Concern  . 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 clotrimazole-betamethasone (LOTRISONE) cream Apply topically 3 (three) times daily as needed. For rash        Allergies  Allergen Reactions  . Lisinopril     REACTION: cough   Family History  Problem Relation Age of Onset  . Cancer Mother     Ovarian Cancer  . Hypertension Father   . Heart disease Neg Hx    BP 132/90  Pulse 72  Temp(Src) 98.3 F (36.8 C) (Oral)  Ht 5\' 7"  (1.702 m)  Wt 184 lb 12.8 oz (83.825 kg)  BMI 28.94 kg/m2  SpO2 97%  Review of Systems  Constitutional: Negative for fever.  HENT: Negative for hearing loss.   Eyes: Negative for visual disturbance.  Respiratory: Negative for shortness of breath.   Cardiovascular: Negative for  chest pain.  Gastrointestinal: Negative for anal bleeding.  Musculoskeletal: Negative for arthralgias.  Skin: Negative for rash.  Neurological: Negative for tremors.  Hematological: Does not bruise/bleed easily.  Psychiatric/Behavioral: Negative for dysphoric mood.      Objective:   Physical Exam VS: see vs page GEN: no distress HEAD: head: no deformity eyes: no periorbital swelling, no proptosis external nose and ears are normal mouth: no lesion seen NECK: supple, thyroid is not enlarged CHEST WALL: no deformity LUNGS: clear to auscultation BREASTS:  No gynecomastia CV: reg rate and rhythm, no murmur ABD: abdomen is soft, nontender.  no hepatosplenomegaly.  not distended.  no hernia RECTAL: normal external and internal exam.  heme neg. PROSTATE:  Normal size.  No nodule MUSCULOSKELETAL: muscle bulk and strength are grossly normal.  no obvious joint swelling.  gait is normal and steady. PULSES:   no carotid bruit NEURO:  cn 2-12 grossly intact.   readily moves all 4's.   SKIN:  Normal texture and temperature.  No rash or suspicious lesion is visible.   NODES:  None palpable at the neck PSYCH: alert, oriented x3.  Does not appear anxious nor depressed.    Assessment & Plan:  Wellness visit today, with problems stable, except as noted.    SEPARATE EVALUATION FOLLOWS--EACH PROBLEM HERE  IS NEW, NOT RESPONDING TO TREATMENT, OR POSES SIGNIFICANT RISK TO THE PATIENT'S HEALTH: HISTORY OF THE PRESENT ILLNESS: elev psa is noted. Denies hematuria Dm: he says diet is good PAST MEDICAL HISTORY reviewed and up to date today REVIEW OF SYSTEMS: Denies weight change  PHYSICAL EXAMINATION: Pulses: dorsalis pedis intact bilat.   Feet: no deformity.  no ulcer on the feet.  feet are of normal color and temp.  no edema Neuro: sensation is intact to touch on the feet LAB/XRAY RESULTS: a1c and psa are noted IMPRESSION: Dm, Needs increased rx, if it can be done with a regimen that avoids  or minimizes hypoglycemia. elev psa, new PLAN: See instruction page

## 2010-12-11 DIAGNOSIS — R972 Elevated prostate specific antigen [PSA]: Secondary | ICD-10-CM | POA: Insufficient documentation

## 2010-12-11 LAB — LDL CHOLESTEROL, DIRECT: Direct LDL: 140 mg/dL

## 2010-12-11 MED ORDER — PRAVASTATIN SODIUM 40 MG PO TABS: 80.0000 mg | ORAL_TABLET | Freq: Every day | ORAL | Status: DC

## 2010-12-11 MED ORDER — LOSARTAN POTASSIUM-HCTZ 100-12.5 MG PO TABS: 1.0000 | ORAL_TABLET | Freq: Every day | ORAL | Status: DC

## 2010-12-11 MED ORDER — METFORMIN HCL ER 500 MG PO TB24: 1000.0000 mg | ORAL_TABLET | Freq: Every day | ORAL | Status: DC

## 2012-01-01 ENCOUNTER — Other Ambulatory Visit: Payer: Self-pay | Admitting: Endocrinology

## 2012-02-14 ENCOUNTER — Encounter (HOSPITAL_COMMUNITY): Payer: Self-pay | Admitting: *Deleted

## 2012-02-14 ENCOUNTER — Inpatient Hospital Stay (HOSPITAL_COMMUNITY)
Admission: RE | Admit: 2012-02-14 | Discharge: 2012-02-18 | DRG: 885 | Disposition: A | Discharge status: Home / Self Care | Payer: Commercial Managed Care - PPO | Source: Ambulatory Visit | Attending: Psychiatry | Admitting: Psychiatry

## 2012-02-14 ENCOUNTER — Emergency Department (HOSPITAL_COMMUNITY)
Admission: EM | Admit: 2012-02-14 | Discharge: 2012-02-14 | Disposition: A | Discharge status: Other Acute Inpatient Hospital | Payer: Commercial Managed Care - PPO

## 2012-02-14 DIAGNOSIS — Z125 Encounter for screening for malignant neoplasm of prostate: Secondary | ICD-10-CM

## 2012-02-14 DIAGNOSIS — R059 Cough, unspecified: Secondary | ICD-10-CM

## 2012-02-14 DIAGNOSIS — F1020 Alcohol dependence, uncomplicated: Secondary | ICD-10-CM | POA: Diagnosis present

## 2012-02-14 DIAGNOSIS — Z79899 Other long term (current) drug therapy: Secondary | ICD-10-CM | POA: Insufficient documentation

## 2012-02-14 DIAGNOSIS — E7800 Pure hypercholesterolemia, unspecified: Secondary | ICD-10-CM

## 2012-02-14 DIAGNOSIS — E119 Type 2 diabetes mellitus without complications: Secondary | ICD-10-CM

## 2012-02-14 DIAGNOSIS — R05 Cough: Secondary | ICD-10-CM

## 2012-02-14 DIAGNOSIS — F3289 Other specified depressive episodes: Secondary | ICD-10-CM | POA: Diagnosis present

## 2012-02-14 DIAGNOSIS — D72819 Decreased white blood cell count, unspecified: Secondary | ICD-10-CM | POA: Insufficient documentation

## 2012-02-14 DIAGNOSIS — F329 Major depressive disorder, single episode, unspecified: Secondary | ICD-10-CM | POA: Diagnosis present

## 2012-02-14 DIAGNOSIS — R45851 Suicidal ideations: Secondary | ICD-10-CM | POA: Insufficient documentation

## 2012-02-14 DIAGNOSIS — I10 Essential (primary) hypertension: Secondary | ICD-10-CM | POA: Insufficient documentation

## 2012-02-14 DIAGNOSIS — F331 Major depressive disorder, recurrent, moderate: Principal | ICD-10-CM | POA: Diagnosis present

## 2012-02-14 DIAGNOSIS — R9431 Abnormal electrocardiogram [ECG] [EKG]: Secondary | ICD-10-CM

## 2012-02-14 DIAGNOSIS — F1910 Other psychoactive substance abuse, uncomplicated: Secondary | ICD-10-CM | POA: Diagnosis present

## 2012-02-14 DIAGNOSIS — F1410 Cocaine abuse, uncomplicated: Secondary | ICD-10-CM | POA: Diagnosis present

## 2012-02-14 DIAGNOSIS — Z87891 Personal history of nicotine dependence: Secondary | ICD-10-CM | POA: Insufficient documentation

## 2012-02-14 DIAGNOSIS — R972 Elevated prostate specific antigen [PSA]: Secondary | ICD-10-CM

## 2012-02-14 LAB — CBC WITH DIFFERENTIAL/PLATELET
Basophils Absolute: 0 10*3/uL (ref 0.0–0.1)
Basophils Relative: 0 % (ref 0–1)
Eosinophils Absolute: 0 10*3/uL (ref 0.0–0.7)
Lymphs Abs: 1.5 10*3/uL (ref 0.7–4.0)
MCH: 23.8 pg — ABNORMAL LOW (ref 26.0–34.0)
Neutrophils Relative %: 70 % (ref 43–77)
Platelets: 297 10*3/uL (ref 150–400)
RBC: 5.8 MIL/uL (ref 4.22–5.81)

## 2012-02-14 LAB — BASIC METABOLIC PANEL
GFR calc Af Amer: >90 mL/min (ref >90)
GFR calc non Af Amer: >90 mL/min (ref >90)
Glucose, Bld: 114 mg/dL — ABNORMAL HIGH (ref 70–99)
Potassium: 3.1 mEq/L — ABNORMAL LOW (ref 3.5–5.1)
Sodium: 140 mEq/L (ref 135–145)

## 2012-02-14 LAB — URINALYSIS, ROUTINE W REFLEX MICROSCOPIC
Glucose, UA: NEGATIVE mg/dL
Ketones, ur: NEGATIVE mg/dL
Protein, ur: NEGATIVE mg/dL

## 2012-02-14 LAB — RAPID URINE DRUG SCREEN, HOSP PERFORMED
Amphetamines: NOT DETECTED
Opiates: NOT DETECTED
Tetrahydrocannabinol: NOT DETECTED

## 2012-02-14 LAB — ETHANOL: Alcohol, Ethyl (B): <11 mg/dL (ref 0–11)

## 2012-02-14 LAB — SALICYLATE LEVEL: Salicylate Lvl: <2 mg/dL — ABNORMAL LOW (ref 2.8–20.0)

## 2012-02-14 LAB — ACETAMINOPHEN LEVEL: Acetaminophen (Tylenol), Serum: <15 ug/mL (ref 10–30)

## 2012-02-14 MED ORDER — LOSARTAN POTASSIUM-HCTZ 100-12.5 MG PO TABS: 1.0000 | ORAL_TABLET | Freq: Every day | ORAL | Status: DC

## 2012-02-14 MED ORDER — ALUM & MAG HYDROXIDE-SIMETH 200-200-20 MG/5ML PO SUSP: 30.0000 mL | ORAL | Status: DC | PRN

## 2012-02-14 MED ORDER — ACETAMINOPHEN 325 MG PO TABS
650.0000 mg | ORAL_TABLET | Freq: Four times a day (QID) | ORAL | Status: DC | PRN
  Administered 2012-02-18: 650 mg via ORAL
  Filled 2012-02-14: qty 2

## 2012-02-14 MED ORDER — MAGNESIUM HYDROXIDE 400 MG/5ML PO SUSP: 30.0000 mL | Freq: Every day | ORAL | Status: DC | PRN

## 2012-02-14 MED ORDER — LOSARTAN POTASSIUM 50 MG PO TABS
100.0000 mg | ORAL_TABLET | Freq: Every day | ORAL | Status: DC
  Filled 2012-02-14 (×2): qty 2

## 2012-02-14 MED ORDER — CLOTRIMAZOLE-BETAMETHASONE 1-0.05 % EX CREA
TOPICAL_CREAM | Freq: Two times a day (BID) | CUTANEOUS | Status: DC
  Administered 2012-02-14: 10:00:00 via TOPICAL
  Filled 2012-02-14 (×2): qty 15

## 2012-02-14 MED ORDER — NICOTINE 21 MG/24HR TD PT24
21.0000 mg | MEDICATED_PATCH | Freq: Every day | TRANSDERMAL | Status: DC
  Filled 2012-02-14: qty 1

## 2012-02-14 MED ORDER — SIMVASTATIN 20 MG PO TABS
ORAL_TABLET | ORAL | Status: AC
  Filled 2012-02-14: qty 1

## 2012-02-14 MED ORDER — METFORMIN HCL ER 500 MG PO TB24
1000.0000 mg | ORAL_TABLET | Freq: Every day | ORAL | Status: DC
Start: 2012-02-14 — End: 2012-02-14
  Filled 2012-02-14 (×5): qty 2

## 2012-02-14 MED ORDER — ZOLPIDEM TARTRATE 5 MG PO TABS: 5.0000 mg | ORAL_TABLET | Freq: Every evening | ORAL | Status: DC | PRN

## 2012-02-14 MED ORDER — SIMVASTATIN 20 MG PO TABS
20.0000 mg | ORAL_TABLET | Freq: Every day | ORAL | Status: DC
  Administered 2012-02-14: 20 mg via ORAL
  Filled 2012-02-14 (×2): qty 1

## 2012-02-14 MED ORDER — ACETAMINOPHEN 325 MG PO TABS
650.0000 mg | ORAL_TABLET | ORAL | Status: DC | PRN
  Administered 2012-02-14: 650 mg via ORAL
  Filled 2012-02-14: qty 2

## 2012-02-14 MED ORDER — LORAZEPAM 1 MG PO TABS: 1.0000 mg | ORAL_TABLET | Freq: Three times a day (TID) | ORAL | Status: DC | PRN

## 2012-02-14 MED ORDER — IBUPROFEN 400 MG PO TABS: 600.0000 mg | ORAL_TABLET | Freq: Three times a day (TID) | ORAL | Status: DC | PRN

## 2012-02-14 MED ORDER — POTASSIUM CHLORIDE 20 MEQ/15ML (10%) PO LIQD
40.0000 meq | Freq: Once | ORAL | Status: AC
  Administered 2012-02-14: 40 meq via ORAL
  Filled 2012-02-14: qty 30

## 2012-02-14 MED ORDER — ONDANSETRON HCL 4 MG PO TABS: 4.0000 mg | ORAL_TABLET | Freq: Three times a day (TID) | ORAL | Status: DC | PRN

## 2012-02-14 MED ORDER — HYDROCHLOROTHIAZIDE 12.5 MG PO CAPS
12.5000 mg | ORAL_CAPSULE | Freq: Every day | ORAL | Status: DC
  Filled 2012-02-14 (×2): qty 1

## 2012-02-14 NOTE — ED Provider Notes (Signed)
History     CSN: [id number]  Arrival date & time 02/14/12  0536   First MD Initiated Contact with Patient 02/14/12 [phone]      Chief Complaint  Patient presents with  . Suicidal    (Consider location/radiation/quality/duration/timing/severity/associated sxs/prior treatment) HPI HX per PT, feeling depressed over the last 2 weeks, uses cocaine, is unemployed, having suicidal thoughts but no active plan, he did tell the triage nurse he was thinking about cutting his wrists. No OD or self injury. MOD in severity, thoughts aggrevated by thinking about "childhood issues". Not currently treated for depression, has h/o PSYCH admit around 2006.   Past Medical History  Diagnosis Date  . HYPERCHOLESTEROLEMIA 03/10/2008  . LEUKOPENIA, CHRONIC 03/10/2008  . DEPRESSION 11/12/2006  . HYPERTENSION 11/12/2006    Echo (12/09) with EF 65%, no valvular abnormalities  . COUGH DUE TO ACE INHIBITORS 09/25/2008  . ELECTROCARDIOGRAM, ABNORMAL 03/10/2008    Past Surgical History  Procedure Date  . Stress cardiolite 10/04/2001    Family History  Problem Relation Age of Onset  . Cancer Mother     Ovarian Cancer  . Hypertension Father   . Heart disease Neg Hx     History  Substance Use Topics  . Smoking status: Former Smoker    Quit date: 04/07/2005  . Smokeless tobacco: Not on file  . Alcohol Use: Yes      Review of Systems  Constitutional: Negative for fever and chills.  HENT: Negative for neck pain and neck stiffness.   Eyes: Negative for pain.  Respiratory: Negative for shortness of breath.   Cardiovascular: Negative for chest pain.  Gastrointestinal: Negative for abdominal pain.  Genitourinary: Negative for dysuria.  Musculoskeletal: Negative for back pain.  Skin: Negative for rash.  Neurological: Negative for headaches.  Psychiatric/Behavioral: Negative for hallucinations and self-injury.  All other systems reviewed and are negative.    Allergies  Lisinopril  Home Medications    Current Outpatient Rx  Name  Route  Sig  Dispense  Refill  . LOSARTAN POTASSIUM-HCTZ 100-12.5 MG PO TABS      TAKE 1 TABLET EVERY DAY   30 tablet   2     Pt is due for CPX appointment for additional refil ...   . CLOTRIMAZOLE-BETAMETHASONE 1-0.05 % EX CREA   Topical   Apply topically 3 (three) times daily as needed. For rash          . METFORMIN HCL ER 500 MG PO TB24   Oral   Take 2 tablets (1,000 mg total) by mouth daily with breakfast.   60 tablet   11   . PRAVASTATIN SODIUM 40 MG PO TABS   Oral   Take 2 tablets (80 mg total) by mouth at bedtime. 2 tablets by mouth at bedtime   60 tablet   11     BP 160/117  Pulse 94  Temp 98.2 F (36.8 C) (Oral)  Resp 18  SpO2 98%  Physical Exam  Constitutional: He is oriented to person, place, and time. He appears well-developed and well-nourished.  HENT:  Head: Normocephalic and atraumatic.  Eyes: EOM are normal. Pupils are equal, round, and reactive to light.  Neck: Neck supple.  Cardiovascular: Normal rate, regular rhythm and intact distal pulses.   Pulmonary/Chest: Effort normal and breath sounds normal. No respiratory distress.  Abdominal: Soft. Bowel sounds are normal. There is no tenderness. There is no rebound and no guarding.  Musculoskeletal: Normal range of motion. He exhibits no edema and  no tenderness.  Neurological: He is alert and oriented to person, place, and time.  Skin: Skin is warm and dry.  Psychiatric:       Depressed affect    ED Course  Procedures (including critical care time)  Psych labs pending, ACT consult, Psych holding orders, plan telepsych. BP noted - home meds initiated including HTN meds  MDM   Depression with some suicidal thoughts, no self injury. Voluntary at this time.         Sunnie Nielsen, MD 02/14/12 [phone]

## 2012-02-14 NOTE — ED Notes (Signed)
Complain of generalized body aches.

## 2012-02-14 NOTE — Tx Team (Signed)
Initial Interdisciplinary Treatment Plan  PATIENT STRENGTHS: (choose at least two) Ability for insight Average or above average intelligence Capable of independent living Communication skills Motivation for treatment/growth Physical Health Supportive family/friends Work skills  PATIENT STRESSORS: Financial difficulties Occupational concerns Substance abuse   PROBLEM LIST: Problem List/Patient Goals Date to be addressed Date deferred Reason deferred Estimated date of resolution  Substance abuse 02/14/2012     depression 02/14/2012     Suicidal ideation 02/14/2012                                          DISCHARGE CRITERIA:  Improved stabilization in mood, thinking, and/or behavior Withdrawal symptoms are absent or subacute and managed without 24-hour nursing intervention  PRELIMINARY DISCHARGE PLAN: Attend 12-step recovery group  PATIENT/FAMIILY INVOLVEMENT: This treatment plan has been presented to and reviewed with the patient, [name].  The patient and family have been given the opportunity to ask questions and make suggestions.  Juliann Pares 02/14/2012, 9:45 PM

## 2012-02-14 NOTE — ED Notes (Signed)
Pt notes depression over last several months, states that he has been contemplating hurting himself and states that he would cut himself with a knife. Pt admits to SI and denies HI at this time. States he would like some sort of inpatient treatment for his depression and SI. Pt AAx4 and Calm and cooperative

## 2012-02-14 NOTE — ED Notes (Signed)
Pt has been accepted to [facility] by Dr. Dub Mikes to bed 306-02.  Reports pt can not be transported until after 7pm.  Pt notified.
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
## 2012-02-14 NOTE — Progress Notes (Signed)
Admission note:  Pt is a 56 year old AAM admitted to the services of Dr. Dub Mikes for substance abuse and depression.  Pt states that he relapsed last week after six years of sobriety.  Pt had started drinking and using crack cocaine again.  Pt has lost his job recently and has been having financial difficulty and marital conflict.  Pt has been increasingly depressed and disappointed in himself due to relapse.  Pt had thoughts of cutting his wrist and decided to come in for help.  He has no major medical issues, and is allergic to Lisinopril.  No s/s of withdrawal noted, CIWA is 0 at present.  Pt lists his wife, Jahleel Whittington, as his emergency contact and she can be reached at [phone]

## 2012-02-14 NOTE — Progress Notes (Signed)
Psychoeducational Group Note  Date:  02/14/2012 Time:  2000  Group Topic/Focus:  Wrap-Up Group:   The focus of this group is to help patients review their daily goal of treatment and discuss progress on daily workbooks.  Participation Level:  Did Not Attend  Participation Quality:    Affect:    Cognitive:    Insight:    Engagement in Group:    Additional Comments:  Pt was not on the unit yet.  Isla Pence M 02/14/2012, 10:02 PM

## 2012-02-14 NOTE — ED Notes (Signed)
Pt calm, cooperative, makes eye contact.  Reports has not taken metformin in "several months."  States PCP put him on it for ? Borderline diabetes but never followed up to see if he still needed it or not so he took himself off of it.  edp notified and states to hold metformin at this time.

## 2012-02-14 NOTE — ED Provider Notes (Signed)
7am:  Pt received at change of shift from Dr. Dierdre Highman.  Telepsych pending. Calm/cooperative.  10am: Telepsych completed: states pt is not suicidal and can be discharged, give outpt resources for AA and community mental health.    11am:  [facility] Tammy Sours has evaluated pt to give resouces:  pt now states that he cannot contract for safety and "doesn't know what I'll do if I go home."  Will need admit.      Laray Anger, DO 02/14/12 1656

## 2012-02-14 NOTE — BH Assessment (Signed)
[facility] Assessment Progress Note      02/14/12 1510.  Pt accepted by [facility] at [facility] to Dr Dub Mikes, [place].  All paperwork completed.  Called [employer], office closed.  Message referred to website: [email or web address] but could not put in notification there, either. Tammy Sours Dossie Der LCSWA

## 2012-02-14 NOTE — ED Notes (Signed)
Pt reporting that he has been depressed for the last month or two. Has not seen a PCP. Pt states the last days or so having thoughts of hurting self by cutting, pt denies any attempt at harming himself to this point.

## 2012-02-14 NOTE — ED Notes (Signed)
[facility] called to transport pt after 7pm, states will send night truck to transport.

## 2012-02-15 ENCOUNTER — Encounter (HOSPITAL_COMMUNITY): Payer: Self-pay | Admitting: Psychiatry

## 2012-02-15 DIAGNOSIS — F1910 Other psychoactive substance abuse, uncomplicated: Secondary | ICD-10-CM | POA: Diagnosis present

## 2012-02-15 DIAGNOSIS — F411 Generalized anxiety disorder: Secondary | ICD-10-CM

## 2012-02-15 MED ORDER — FLUOXETINE HCL 10 MG PO CAPS
10.0000 mg | ORAL_CAPSULE | Freq: Every day | ORAL | Status: DC
  Filled 2012-02-15 (×2): qty 1

## 2012-02-15 MED ORDER — LOSARTAN POTASSIUM 50 MG PO TABS
100.0000 mg | ORAL_TABLET | Freq: Every day | ORAL | Status: DC
  Administered 2012-02-15 – 2012-02-18 (×4): 100 mg via ORAL
  Filled 2012-02-15 (×6): qty 2

## 2012-02-15 MED ORDER — LOSARTAN POTASSIUM-HCTZ 100-12.5 MG PO TABS: 1.0000 | ORAL_TABLET | Freq: Every day | ORAL | Status: DC

## 2012-02-15 MED ORDER — FLUOXETINE HCL 10 MG PO CAPS
10.0000 mg | ORAL_CAPSULE | Freq: Every day | ORAL | Status: DC
  Administered 2012-02-15: 10 mg via ORAL
  Filled 2012-02-15 (×2): qty 1

## 2012-02-15 MED ORDER — HYDROCHLOROTHIAZIDE 12.5 MG PO CAPS
12.5000 mg | ORAL_CAPSULE | Freq: Every day | ORAL | Status: DC
  Administered 2012-02-15 – 2012-02-18 (×4): 12.5 mg via ORAL
  Filled 2012-02-15 (×6): qty 1

## 2012-02-15 MED ORDER — GABAPENTIN 100 MG PO CAPS
100.0000 mg | ORAL_CAPSULE | Freq: Three times a day (TID) | ORAL | Status: DC
  Filled 2012-02-15 (×6): qty 1

## 2012-02-15 MED ORDER — HYDROXYZINE HCL 25 MG PO TABS: 25.0000 mg | ORAL_TABLET | Freq: Four times a day (QID) | ORAL | Status: DC | PRN

## 2012-02-15 MED ORDER — GABAPENTIN 100 MG PO CAPS
100.0000 mg | ORAL_CAPSULE | Freq: Three times a day (TID) | ORAL | Status: DC
  Administered 2012-02-15 – 2012-02-18 (×10): 100 mg via ORAL
  Filled 2012-02-15 (×15): qty 1

## 2012-02-15 NOTE — Progress Notes (Signed)
Psychoeducational Group Note  Date: 02/15/2012  Time: 1315  Group Topic/Focus:  Healthy Support Systems  Participation Level: Active  Participation Quality: Appropriate, Attentive and Sharing  Affect: Appropriate  Cognitive: Alert and Appropriate  Insight: Good  Engagement in Group: Good   

## 2012-02-15 NOTE — H&P (Signed)
Medical/psychiatric screening examination/treatment/procedure(s) were performed by non-physician practitioner and as supervising physician I was immediately available for consultation/collaboration.  I have seen and examined this patient and agree with the major elements of this evaluation.  

## 2012-02-15 NOTE — Progress Notes (Signed)
Psychoeducational Group Note  Date:  02/15/2012 Time:  0915  Group Topic/Focus:  Spirituality:   The focus of this group is to discuss how one's spirituality can aide in recovery.  Participation Level:  Did Not Attend  [name] 02/15/2012, 12:02 PM

## 2012-02-15 NOTE — Progress Notes (Signed)
Patient did attend the evening speaker AA meeting.  

## 2012-02-15 NOTE — Progress Notes (Signed)
D-Patient is out in milieu interacting with peers and attending groups with good participation.  Rates depression at 8 and hopelessness at 9.  Denies SI. Is insightful with recovery process and desires outpatient treatment following d/c. No observable s/s of withdrawal and no prn medications requested. Reports good sleep and appetite.  A- Support and encouragement given.  Continue current POC and evaluation of treatment goals.  Continue assessment of any drug/etoh withdrawal.  15' checks for safety. R- Remains safe on unit. Verbalizes understanding of unit policies. Behavior/mood/affect are appropriate.

## 2012-02-15 NOTE — Clinical Social Work Note (Signed)
[facility] Group Notes:  (Clinical Social Work)  02/15/2012  10:00-11:00AM  Summary of Progress/Problems:   The main focus of today's process group was for the patient to identify ways to remain safe upon discharge and ways to prevent future hospitalizations.  It was also discussed how people may sabotage their own recovery, and reasons they may do this.  The patient expressed that he had been sober for 6 years when he sabotaged himself by deciding he no longer needed a sponsor.  He then decided against going to meetings, and then thought he could have 1 beer, which led to more.  He has been hiding alcohol in his home so his wife would not know he was using.  This also led to using crack.  Patient stated he has depression even when he is not using, so he needs to see a counselor in addition to going to [employer]  Type of Therapy:  Group Therapy  Participation Level:  Active  Participation Quality:  Appropriate, Attentive, Sharing and Supportive  Affect:  Appropriate  Cognitive:  Alert and Appropriate  Insight:  Good  Engagement in Group:  Good  Engagement in Therapy:  Good  Modes of Intervention:  Clarification, Education, Limit-setting, Problem-solving, Socialization, Support and Processing   Tristan Mantle, LCSW 02/15/2012, 12:51 PM

## 2012-02-15 NOTE — [facility] Suicide Risk Assessment (Addendum)
Suicide Risk Assessment  Admission Assessment     Nursing information obtained from:  Patient Demographic factors:  Male;Unemployed Current Mental Status:  Suicidal ideation indicated by patient Patient denies suicidal or homicidal ideation, hallucinations, illusions, or delusions. Patient engages with good eye contact, is able to focus adequately in a one to one setting, and has clear goal directed thoughts. Patient speaks with a natural conversational volume, rate, and tone. Anxiety was reported at 8 on a scale of 1 the least and 10 the most. Depression was reported at 8 on the same scale. Patient is oriented times 4, recent and remote memory intact. Judgement: impaired by mental illness and addictive thinking Insight: impaired by mental illness and addictive thinking  Loss Factors:  Financial problems / change in socioeconomic status Historical Factors:  Family history of mental illness or substance abuse Risk Reduction Factors:  Sense of responsibility to family;Living with another person, especially a relative;Positive social support  CLINICAL FACTORS:   Depression:   Anhedonia Comorbid alcohol abuse/dependence Hopelessness Impulsivity Insomnia Alcohol/Substance Abuse/Dependencies Previous Psychiatric Diagnoses and Treatments  COGNITIVE FEATURES THAT CONTRIBUTE TO RISK:  Thought constriction (tunnel vision)    SUICIDE RISK:   Moderate:  Frequent suicidal ideation with limited intensity, and duration, some specificity in terms of plans, no associated intent, good self-control, limited dysphoria/symptomatology, some risk factors present, and identifiable protective factors, including available and accessible social support.  PLAN OF CARE: Detox, restart Prozac and add Neurontin for anxiety.Restart meetings. Return to home  [place], [place] 02/15/2012, 9:19 AM

## 2012-02-15 NOTE — Progress Notes (Signed)
Psychoeducational Group Note  Date:  02/15/2012 Time:  1515  Group Topic/Focus:  Making Healthy Choices:   The focus of this group is to help patients identify negative/unhealthy choices they were using prior to admission and identify positive/healthier coping strategies to replace them upon discharge.  Participation Level:  Did Not Attend  Dalia Heading 02/15/2012, 7:51 PM
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
## 2012-02-16 DIAGNOSIS — F1910 Other psychoactive substance abuse, uncomplicated: Secondary | ICD-10-CM

## 2012-02-16 DIAGNOSIS — F341 Dysthymic disorder: Secondary | ICD-10-CM

## 2012-02-16 DIAGNOSIS — F1010 Alcohol abuse, uncomplicated: Secondary | ICD-10-CM

## 2012-02-16 DIAGNOSIS — F1020 Alcohol dependence, uncomplicated: Secondary | ICD-10-CM | POA: Diagnosis present

## 2012-02-16 MED ORDER — FLUOXETINE HCL 20 MG PO CAPS
20.0000 mg | ORAL_CAPSULE | Freq: Every day | ORAL | Status: DC
  Administered 2012-02-16 – 2012-02-17 (×2): 20 mg via ORAL
  Filled 2012-02-16 (×4): qty 1

## 2012-02-16 NOTE — Progress Notes (Signed)
[facility] Group Notes:  (Counselor/Nursing/MHT/Case Management/Adjunct)  02/16/2012 9:30 PM  Type of Therapy:  Psychoeducational Skills  Participation Level:  Active  Participation Quality:  Appropriate  Affect:  Appropriate  Cognitive:  Alert  Insight:  Good  Engagement in Group:  Good  Engagement in Therapy:  Good  Modes of Intervention:  Problem-solving  Summary of Progress/Problems:   [name] 02/16/2012, 9:30 PM

## 2012-02-16 NOTE — Tx Team (Signed)
Interdisciplinary Treatment Plan Update (Adult)  Date:  02/16/2012  Time Reviewed:  11:48 AM   Progress in Treatment: Attending groups: Yes Participating in groups:  Yes Taking medication as prescribed: Yes Tolerating medication:  Yes Family/Significant othe contact made:  No, pt gave consent to contact wife Patient understands diagnosis:  Yes Discussing patient identified problems/goals with staff:  Yes Medical problems stabilized or resolved:  Yes Denies suicidal/homicidal ideation: Yes Issues/concerns per patient self-inventory:  None identified Other: N/A  New problem(s) identified: None Identified  Reason for Continuation of Hospitalization: Anxiety Depression Medication stabilization  Interventions implemented related to continuation of hospitalization: mood stabilization, medication monitoring and adjustment, group therapy and psycho education, safety checks q 15 mins  Additional comments: N/A  Estimated length of stay: 3-4 days  Discharge Plan: CSW is assessing for appropriate referrals.    New goal(s): N/A  Review of initial/current patient goals per problem list:    1.  Goal(s): Address substance use  Met:  No  Target date: by discharge  As evidenced by: completing detox protocol and refer to appropriate treatment  2.  Goal (s): Reduce depressive and anxiety symptoms  Met:  No  Target date: by discharge  As evidenced by: Reducing depression from a 10 to a 3 as reported by pt.    3.  Goal(s): Eliminate SI  Met:  No  Target date: by discharge  As evidenced by: Pt denying SI   Attendees: Patient:     Family:     Physician: Geoffery Lyons, MD 02/16/2012 11:48 AM   Nursing: Rosemarie Beath, RN 02/16/2012 11:48 AM   Clinical Social Worker:  Reyes Ivan, LCSWA 02/16/2012  11:48 AM   Other: Nanine Means, NP 02/16/2012  11:48 AM   Other:  Bubba Camp, Psyc intern 02/16/2012 11:49 AM   Other:     Other:     Other:      Scribe for Treatment Team:    Reyes Ivan 02/16/2012 11:48 AM

## 2012-02-16 NOTE — Social Work (Signed)
Aftercare Planning Group: 02/16/2012 9:45 AM  Pt attended discharge planning group and actively participated in group.  CSW provided pt with today's workbook.  Pt presents with flat affect and depressed mood.  Pt states that he came to the hospital due to being "seriously depressed", suicidal, hopeless, helpless and relapsing.  Pt states that he was clean and sober for 6 years and relapsed on alcohol and crack/cocaine.  Pt states that he is depressed about being unemployed since May and finances are tight in the home.  Pt states that he lives in [place] with his wife and kids and has transportation, but reports his care is at [facility].  Pt doesn't have a psychiatrist and therapist but is open to any referrals.  CSW will assess for appropriate referrals.  No further needs voiced by pt at this time.  Safety planning and suicide prevention discussed.  Pt participated in discussion and acknowledged an understanding of the information provided.       [facility] Group Note : Clinical Social Worker Group Therapy  02/16/2012  1:15 PM  Type of Therapy:  Group Therapy  Participation Level:  Appropriate  Participation Quality:  Appropriate   Affect:  Appropriate  Cognitive:  Alert  Insight:  Good  Engagement in Group:  Good  Engagement in Therapy:  Good  Modes of Intervention:  Clarification, Education, Problem-solving, Socialization and Support  Summary of Progress/Problems: The topic for group today was overcoming obstacles.  Pt discussed overcoming obstacles and what this means for pt. Pt discussed having more faith in God to overcome his addiction.      Kealani Leckey Horton, LCSWA 02/16/2012 3:00 pm

## 2012-02-16 NOTE — Progress Notes (Signed)
[facility] MD Progress Note  02/16/2012 11:02 AM [name]  MRN:  [id number]  Diagnosis:   Axis I: Major Depression, recurrent, moderate, Alcohol Dependence Axis II: Deferred Axis III:  Past Medical History  Diagnosis Date  . HYPERCHOLESTEROLEMIA 03/10/2008  . LEUKOPENIA, CHRONIC 03/10/2008  . DEPRESSION 11/12/2006  . HYPERTENSION 11/12/2006    Echo (12/09) with EF 65%, no valvular abnormalities  . COUGH DUE TO ACE INHIBITORS 09/25/2008  . ELECTROCARDIOGRAM, ABNORMAL 03/10/2008   Axis IV: occupational problems Axis V: 51-60 moderate symptoms  ADL's:  Intact  Sleep: Fair  Appetite:  Fair  Suicidal Ideation:  Plan:  No active plan Intent:  Denies Hopkins:  Denies Homicidal Ideation:  Plan:  Denies Intent:  Denies Hopkins:  Denies  Admits that he is very depressed. After six years of sobriety he relapsed. He was going to meetings, had a sponsor. He slowly led the sponsor go, and then the meetings. 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 Lives with his wife, basically OK, occassionally conflictive ( 11, 7 Y/O) kids. Saw himself going down, getting depressed, feeling guilty for the relapse. Started sneaking a beer, took it before he went to bed, then it escalated. Depression hit him, was admitted with S.I. Mental Status Examination/Evaluation: Objective:  Appearance: Fairly Groomed  Patent attorney::  Fair  Speech:  Normal Rate  Volume:  Decreased  Mood:  Depressed  Affect:  Restricted  Thought Process:  Coherent and Goal Directed  Orientation:  Full  Thought Content:  WDL  Suicidal Thoughts:  Yes.  without intent/plan  Homicidal Thoughts:  No  Memory:  Immediate;   Fair Recent;   Fair Remote;   Fair  Judgement:  Fair  Insight:  Present  Psychomotor Activity:  Decreased  Concentration:  Fair  Recall:  Fair  Akathisia:  No  Handed:  Right  AIMS (if indicated):     Assets:  Communication Skills Desire for Improvement Housing Social  Support Vocational/Educational  Sleep:  Number of Hours: 5.5    Vital Signs:Blood pressure 131/93, pulse 80, temperature 97 F (36.1 C), temperature source Oral, resp. rate 16, height 5\' 6"  (1.676 m), weight 77.111 kg (170 lb). Current Medications: Current Facility-Administered Medications  Medication Dose Route Frequency Provider Last Rate Last Dose  . acetaminophen (TYLENOL) tablet 650 mg  650 mg Oral Q6H PRN Tristan Rankin, NP      . alum & mag hydroxide-simeth (MAALOX/MYLANTA) 200-200-20 MG/5ML suspension 30 mL  30 mL Oral Q4H PRN Tristan Rankin, NP      . FLUoxetine (PROZAC) capsule 10 mg  10 mg Oral Daily Tristan Means, NP   10 mg at 02/15/12 1629  . gabapentin (NEURONTIN) capsule 100 mg  100 mg Oral TID Tristan Means, NP   100 mg at 02/16/12 0826  . losartan (COZAAR) tablet 100 mg  100 mg Oral Daily Tristan Craze, MD   100 mg at 02/16/12 0825   And  . hydrochlorothiazide (MICROZIDE) capsule 12.5 mg  12.5 mg Oral Daily Tristan Craze, MD   12.5 mg at 02/16/12 0825  . hydrOXYzine (ATARAX/VISTARIL) tablet 25 mg  25 mg Oral Q6H PRN Tristan Means, NP      . magnesium hydroxide (MILK OF MAGNESIA) suspension 30 mL  30 mL Oral Daily PRN Tristan Rankin, NP      . [DISCONTINUED] FLUoxetine (PROZAC) capsule 10 mg  10 mg Oral Daily Tristan Craze, MD      . [DISCONTINUED]  gabapentin (NEURONTIN) capsule 100 mg  100 mg Oral TID Tristan Craze, MD      . [DISCONTINUED] losartan-hydrochlorothiazide (HYZAAR) 100-12.5 MG per tablet 1 tablet  1 tablet Oral Daily Tristan Means, NP        Lab Results: No results found for this or any previous visit (from the past 48 hour(s)).  Physical Findings: AIMS: Facial and Oral Movements Muscles of Facial Expression: None, normal Lips and Perioral Area: None, normal Jaw: None, normal Tongue: None, normal,Extremity Movements Upper (arms, wrists, hands, fingers): None, normal Lower (legs, knees, ankles, toes): None, normal, Trunk Movements Neck, shoulders, hips:  None, normal, Overall Severity Severity of abnormal movements (highest score from questions above): None, normal Incapacitation due to abnormal movements: None, normal Patient's awareness of abnormal movements (rate only patient's report): No Awareness, Dental Status Current problems with teeth and/or dentures?: No Does patient usually wear dentures?: No  CIWA:  CIWA-Ar Total: 0  COWS:     Treatment Plan Summary: Daily contact with patient to assess and evaluate symptoms and progress in treatment Medication management Supportive approach/coping skills/relapse prevention  Plan: Continue the detox           Pursue the Prozac to help with the depression                                Neurontin for the anxiety, craving  [name] A 02/16/2012, 11:02 AM
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
## 2012-02-16 NOTE — Progress Notes (Signed)
Psychoeducational Group Note  Date:  02/16/2012 Time:  11:00am  Group Topic/Focus:  Self Care:   The focus of this group is to help patients understand the importance of self-care in order to improve or restore emotional, physical, spiritual, interpersonal, and financial health.  Participation Level:  Active  Participation Quality:  Appropriate  Affect:  Appropriate  Cognitive:  Appropriate  Insight:  Good  Engagement in Group:  Good  Additional Comments: Staff explained to the patient that this group is designed to assist in identifying activities that they will incorporate into their daily living with the intention of improving or restoring emotional, physical, spiritual, interpersonal and financial health. The patients were asked to identify one area or skill where they are taking care of themselves. Patients will identify 2-3 activities of self- care that they will use in their daily living after discharge. Patients were encouraged to utilize the skills and techniques taught and apply it in their daily routine.    Ardelle Park O 02/16/2012, 3:43 PM

## 2012-02-16 NOTE — Progress Notes (Signed)
D: Patient stated on self inventory sheet: depression 1/10 and hopelessness 1/10. Patient denies SI/HI/AVH. Patient denies pain and show no s/s of distress. Patient has been cooperative. Patient stated he plan to "develop better coping skills. Find outside counseling therapy. Get back in AA and get a sponsor. A: Medications given as ordered by MD. Verbal support given. Patient encouraged to attend groups. 15 minute checks performed for safety. R: Patient is cooperative. Anxious. Took all meds. Patient remains safe on unit

## 2012-02-17 NOTE — Progress Notes (Signed)
Patient ID: [name], male   DOB: February 02, 1956, 56 y.o.   MRN: [id number] Patient behavior has been appropriate. Mood and affect depressed. Denies SI. Compliant with medications.

## 2012-02-17 NOTE — Progress Notes (Signed)
[facility] Group Notes:  (Counselor/Nursing/MHT/Case Management/Adjunct)  02/17/2012 5:28 PM  Type of Therapy:  Psychoeducational Skills  Participation Level:  Active  Participation Quality:  Appropriate, Attentive, Sharing and Supportive  Affect:  Appropriate  Cognitive:  Alert, Appropriate and Oriented  Insight:  Good  Engagement in Group:  Good  Engagement in Therapy:  n/a  Modes of Intervention:  Activity, Education, Problem-solving, Socialization and Support  Summary of Progress/Problems: Agricultural consultant attended a psychoeducational group on labels. Zerek participated in an activity labeling self and peers and choose to label himself as the President for the activity. Elden was active and insightful while group discussed what labels are, how they change the way we think about and perceive the world, and listed positive and negative labels they have used or been called. Austan was given a homework assignment to list 10 words he has been labeled and to find the reality of that situation/label.    Wandra Scot 02/17/2012, 5:28 PM

## 2012-02-17 NOTE — Social Work (Signed)
Aftercare Planning Group: 02/17/2012 9:45 AM  Pt attended discharge planning group and actively participated in group.  CSW provided pt with today's workbook.  Pt presents with calm mood and affect.  Pt rates depression and anxiety at a 1 today.  Pt denies SI/HI.  Pt reports feeling much better and looking forward to taking care of things when he gets home.  Pt is hopeful he can d/c soon.  Pt has follow up scheduled at [facility] for medication management and therapy.  No further needs voiced by pt at this time.    [facility] Group Note : Clinical Social Worker Group Therapy  02/17/2012  1:15 PM  Type of Therapy:  Group Therapy  Participation Level:  Did Not Attend group with speaker from mental health association  Reyes Ivan, LCSWA 02/17/2012 3:00 pm

## 2012-02-17 NOTE — Progress Notes (Signed)
[facility] MD Progress Note  02/17/2012 12:34 PM CANNEN DUPRAS  MRN:  [id number]  Diagnosis:  Alcohol Dependence, Major Depression, recurrent, moderate  ADL's:  Intact  Sleep: Fair  Appetite:  Fair  Suicidal Ideation:  Plan:  Denies Intent:  Denies Means:  Denies Homicidal Ideation:  Plan:  Denies Intent:  Denies Means:  Denies Upset this morning as his daughter is having an ear ache, and she went to school feeling like this. Endorses that he is worried as if she was to have to be picked up from school they do not have a car right now. He says he needs to be there for his family. 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  He feels that he is back on track. Coming here has allowed him to get his coping back in place. He will pursue further recovery work once he is out of here. Feels that being back on his medications is gong tohelp  Mental Status Examination/Evaluation: Objective:  Appearance: Fairly Groomed  Patent attorney::  Fair  Speech:  Clear and Coherent and Normal Rate  Volume:  Normal  Mood:  Worried  Affect:  Appropriate  Thought Process:  Coherent and Goal Directed  Orientation:  Full  Thought Content:  WDL  Suicidal Thoughts:  No  Homicidal Thoughts:  No  Memory:  Immediate;   Fair Recent;   Fair Remote;   Fair  Judgement:  Fair  Insight:  Present  Psychomotor Activity:  Normal  Concentration:  Fair  Recall:  Fair  Akathisia:  No  Handed:  Right  AIMS (if indicated):     Assets:  Communication Skills Desire for Improvement Housing Vocational/Educational  Sleep:  Number of Hours: 6.5    Vital Signs:Blood pressure 138/91, pulse 78, temperature 98.1 F (36.7 C), temperature source Oral, resp. rate 16, height 5\' 6"  (1.676 m), weight 77.111 kg (170 lb). Current Medications: Current Facility-Administered Medications  Medication Dose Route Frequency Provider Last  Rate Last Dose  . acetaminophen (TYLENOL) tablet 650 mg  650 mg Oral Q6H PRN Shuvon Rankin, NP      . alum & mag hydroxide-simeth (MAALOX/MYLANTA) 200-200-20 MG/5ML suspension 30 mL  30 mL Oral Q4H PRN Shuvon Rankin, NP      . FLUoxetine (PROZAC) capsule 20 mg  20 mg Oral Daily Rachael Fee, MD   20 mg at 02/16/12 1940  . gabapentin (NEURONTIN) capsule 100 mg  100 mg Oral TID Nanine Means, NP   100 mg at 02/17/12 1148  . losartan (COZAAR) tablet 100 mg  100 mg Oral Daily Mike Craze, MD   100 mg at 02/17/12 4098   And  . hydrochlorothiazide (MICROZIDE) capsule 12.5 mg  12.5 mg Oral Daily Mike Craze, MD   12.5 mg at 02/17/12 1191  . hydrOXYzine (ATARAX/VISTARIL) tablet 25 mg  25 mg Oral Q6H PRN Nanine Means, NP      . magnesium hydroxide (MILK OF MAGNESIA) suspension 30 mL  30 mL Oral Daily PRN Shuvon Rankin, NP        Lab Results: No results found for this or any previous visit (from the past 48 hour(s)).  Physical Findings: AIMS: Facial and Oral Movements Muscles of Facial Expression: None, normal Lips and Perioral Area: None, normal Jaw: None, normal Tongue: None, normal,Extremity Movements Upper (arms, wrists, hands, fingers): None, normal Lower (legs, knees, ankles,  toes): None, normal, Trunk Movements Neck, shoulders, hips: None, normal, Overall Severity Severity of abnormal movements (highest score from questions above): None, normal Incapacitation due to abnormal movements: None, normal Patient's awareness of abnormal movements (rate only patient's report): No Awareness, Dental Status Current problems with teeth and/or dentures?: No Does patient usually wear dentures?: No  CIWA:  CIWA-Ar Total: 0  COWS:     Treatment Plan Summary: Daily contact with patient to assess and evaluate symptoms and progress in treatment Medication management  Plan: Supportive approach/coping skills/relapse prevention           Will pursue the Prozac further  Felita Bump A 02/17/2012,  12:34 PM

## 2012-02-17 NOTE — Progress Notes (Signed)
D: Pt mood is depressed. Pt rates depression 2/10 and hopelessness 2/10. Minimal interaction tonight. Pt attended group.  A: Support given. Verbalization encouraged. Pt encouraged to talk to nurse about any concerns or complaints. Medications given as ordered.   R: Pt is receptive. No complaints of pain or discomfort. Q15 min safety checks maintained. Will continue to monitor.

## 2012-02-17 NOTE — Progress Notes (Signed)
Patient ID: [name], male   DOB: 1955-08-07, 57 y.o.   MRN: [id number] He has been up and to groups interacting with peers and staff. Self inventory: depression and hopelessness at 1. Denies SI thoughts , pain and withdrawal symptoms.

## 2012-02-17 NOTE — Progress Notes (Signed)
Patient resting quietly with eyes closed. Respirations even and unlabored. No distress noted. Q 15 minute check continues to maintain safety   

## 2012-02-18 MED ORDER — GABAPENTIN 100 MG PO CAPS: 100.0000 mg | ORAL_CAPSULE | Freq: Three times a day (TID) | ORAL | Status: DC

## 2012-02-18 MED ORDER — FLUOXETINE HCL 20 MG PO CAPS: 20.0000 mg | ORAL_CAPSULE | Freq: Every day | ORAL | Status: DC

## 2012-02-18 MED ORDER — HYDROXYZINE HCL 25 MG PO TABS: 25.0000 mg | ORAL_TABLET | Freq: Four times a day (QID) | ORAL | Status: DC | PRN

## 2012-02-18 MED ORDER — LOSARTAN POTASSIUM-HCTZ 100-12.5 MG PO TABS: 1.0000 | ORAL_TABLET | Freq: Every day | ORAL | Status: DC

## 2012-02-18 NOTE — Social Work (Signed)
[facility] Group Note : Clinical Social Worker Group Therapy  02/18/2012  1:15 PM  Type of Therapy:  Group Therapy  Participation Level:  Appropriate  Participation Quality:  Appropriate   Affect:  Appropriate  Cognitive:  Alert  Insight:  Good  Engagement in Group:  Good  Engagement in Therapy:  Good  Modes of Intervention:  Clarification, Education, Limit-setting, Problem-solving, Socialization and Support  Summary of Progress/Problems: The topic for group today was emotional regulation.  Pt participated in the discussion regarding what emotional regulation is and how it affects their life, positive and negative.  Pt discussed coping skills and ways they can regulate their emotions in a positive manner.      Kirtis Challis Horton, LCSWA 02/18/2012 3:00 pm

## 2012-02-18 NOTE — Discharge Summary (Signed)
Physician Discharge Summary Note  Patient:  [name] is an 56 y.o., male MRN:  [id number] DOB:  1956/02/21 Patient phone:  [phone] (home)  Patient address:   [address] [place] [address],   Date of Admission:  02/14/2012 Date of Discharge: 02/18/2012  Reason for Admission:  Alcohol and drug detox, depression  Discharge Diagnoses: Active Problems:  DEPRESSION  Alcohol dependence  Axis Diagnosis:  AXIS I:  Alcohol Abuse, Anxiety Disorder NOS, Depressive Disorder NOS and Substance Abuse AXIS II:  Deferred AXIS III:   Past Medical History  Diagnosis Date  . HYPERCHOLESTEROLEMIA 03/10/2008  . LEUKOPENIA, CHRONIC 03/10/2008  . DEPRESSION 11/12/2006  . HYPERTENSION 11/12/2006    Echo (12/09) with EF 65%, no valvular abnormalities  . COUGH DUE TO ACE INHIBITORS 09/25/2008  . ELECTROCARDIOGRAM, ABNORMAL 03/10/2008   AXIS IV:  economic problems, occupational problems, other psychosocial or environmental problems and problems related to social environment AXIS V:  61-70 mild symptoms  Level of Care:  OP  Hospital Course:   Patient attended individual and group therapy while inpatient along with attending AA groups, one-one time with MD daily, medications for detox managed during inpatient, follow-up appointments made prior to discharge   Consults:  None  Significant Diagnostic Studies:  labs: completed and reviewed in ED, stable  Discharge Vitals:   Blood pressure 133/95, pulse 79, temperature 98.5 F (36.9 C), temperature source Oral, resp. rate 16, height 5\' 6"  (1.676 m), weight 77.111 kg (170 lb). Lab Results:   No results found for this or any previous visit (from the past 72 hour(s)).  Physical Findings: AIMS: Facial and Oral Movements Muscles of Facial Expression: None, normal Lips and Perioral Area: None, normal Jaw: None, normal Tongue: None, normal,Extremity Movements Upper (arms, wrists, hands, fingers): None, normal Lower (legs, knees, ankles,  toes): None, normal, Trunk Movements Neck, shoulders, hips: None, normal, Overall Severity Severity of abnormal movements (highest score from questions above): None, normal Incapacitation due to abnormal movements: None, normal Patient's awareness of abnormal movements (rate only patient's report): No Awareness, Dental Status Current problems with teeth and/or dentures?: No Does patient usually wear dentures?: No  CIWA:  CIWA-Ar Total: 0  COWS:     Mental Status Exam: See Mental Status Examination and Suicide Risk Assessment completed by Attending Physician prior to discharge.  Discharge destination:  Home  Is patient on multiple antipsychotic therapies at discharge:  No   Has Patient had three or more failed trials of antipsychotic monotherapy by history:  No Recommended Plan for Multiple Antipsychotic Therapies:N/A  Discharge Orders    Future Appointments: Provider: Department: Dept Phone: Center:   03/10/2012 2:00 PM Geanie Berlin, LCSW [facility] [place] [phone] None     Future Orders Please Complete By Expires   Diet - low sodium heart healthy      Activity as tolerated - No restrictions          Medication List     As of 02/18/2012 10:05 AM    TAKE these medications      Indication    FLUoxetine 20 MG capsule   Commonly known as: PROZAC   Take 1 capsule (20 mg total) by mouth daily.    Indication: Depression      gabapentin 100 MG capsule   Commonly known as: NEURONTIN   Take 1 capsule (100 mg total) by mouth 3 (three) times daily.    Indication: Alcohol Withdrawal Syndrome, anxiety      hydrOXYzine 25  MG tablet   Commonly known as: ATARAX/VISTARIL   Take 1 tablet (25 mg total) by mouth every 6 (six) hours as needed for anxiety.    Indication: Anxiety Neurosis      losartan-hydrochlorothiazide 100-12.5 MG per tablet   Commonly known as: HYZAAR   Take 1 tablet by mouth daily.    Indication: High Blood Pressure             Follow-up Information    Follow up with [facility] - Outpatient. On 03/10/2012. (Appointment scheduled at 2:00 pm (arrive at 1:15 pm for paperwork) with Orvan July)    Contact information:   [address] [place], [place] [address] [phone]      Follow up with [facility] - Outpatient. On 03/23/2012. (Appointment scheduled at 9:00 am with Dr. Lolly Mustache)    Contact information:   [address] [place], [place] [address] [phone]        Follow-up recommendations:  Activity as tolerated, low-sodium heart healthy diet  Comments:  Patient denied suicidal/homicidal ideations and auditory/visual hallucinations, follow-up appointments encouraged to attend, outside support groups encouraged and information given   Signed: Nanine Means, NP 02/18/2012, 10:05 AM

## 2012-02-18 NOTE — Progress Notes (Signed)
Patient ID: [name], male   DOB: 08-27-1955, 56 y.o.   MRN: [id number] He was discharged home. Picked up by his wife. He voiced that he was going to fine a AA meeting tonight and go to it.  He voiced understanding of discharge instruction and of follow up plan. Denies thoughts of SI. All belongings taken home with him.

## 2012-02-18 NOTE — [facility] Suicide Risk Assessment (Signed)
Suicide Risk Assessment  Discharge Assessment     Demographic Factors:  NA  Mental Status Per Nursing Assessment::   On Admission:  Suicidal ideation indicated by patient  Current Mental Status by Physician: In full contact with reality. There are no suicidal ideas, plans or intent. His mood is stable, his affect is [name], broad. He is committed to continue to work on long term abstinence, to work on maintaining his depression in remission. Looking forward to going home today.   Loss Factors: Decrease in vocational status and Financial problems/change in socioeconomic status  Historical Factors: NA  Risk Reduction Factors:   Responsible for children under 29 years of age, Sense of responsibility to family, Religious beliefs about death, Living with another person, especially a relative and Positive social support  Continued Clinical Symptoms:  Alcohol/Substance Abuse/Dependencies  Cognitive Features That Contribute To Risk: No evidence   Suicide Risk:  Minimal: No identifiable suicidal ideation.  Patients presenting with no risk factors but with morbid ruminations; may be classified as minimal risk based on the severity of the depressive symptoms  Discharge Diagnoses:   AXIS I:  Alcohol dependence, Major depression recurrent, moderate AXIS II:  Deferred AXIS III:   Past Medical History  Diagnosis Date  . HYPERCHOLESTEROLEMIA 03/10/2008  . LEUKOPENIA, CHRONIC 03/10/2008  . DEPRESSION 11/12/2006  . HYPERTENSION 11/12/2006    Echo (12/09) with EF 65%, no valvular abnormalities  . COUGH DUE TO ACE INHIBITORS 09/25/2008  . ELECTROCARDIOGRAM, ABNORMAL 03/10/2008   AXIS IV:  economic problems and occupational problems AXIS V:  61-70 mild symptoms  Plan Of Care/Follow-up recommendations:  Activity:  As tolerated Diet:  As tolerated  Is patient on multiple antipsychotic therapies at discharge:  No   Has Patient had three or more failed trials of antipsychotic monotherapy by  history:  No  Recommended Plan for Multiple Antipsychotic Therapies: N/A  [name] A 02/18/2012, 1:33 PM

## 2012-02-18 NOTE — Progress Notes (Signed)
[facility] Case Management Discharge Plan:  Will you be returning to the same living situation after discharge: Yes,  returning to own home At discharge, do you have transportation home?:Yes,  access to transportation Do you have the ability to pay for your medications:Yes,  access to meds   Release of information consent forms completed and in the chart;  Patient's signature needed at discharge.  Patient to Follow up at:  Follow-up Information    Follow up with [facility] - Outpatient. On 03/10/2012. (Appointment scheduled at 2:00 pm (arrive at 1:15 pm for paperwork) with Orvan July)    Contact information:   [address] [place], [place] [address] [phone]      Follow up with [facility] - Outpatient. On 03/23/2012. (Appointment scheduled at 9:00 am with Dr. Lolly Mustache)    Contact information:   [address] [place], [place] [address] [phone]         Patient denies SI/HI:   Yes,  denies SI/HI    Safety Planning and Suicide Prevention discussed:  Yes,  discussed with pt today  Barrier to discharge identified:No.  Summary and Recommendations: Pt attended discharge planning group and actively participated in group.  CSW provided pt with today's workbook.  Pt presents with calm mood and affect.  Pt rates depression at a 0 and anxiety at a 3-4 today.  Pt denies SI/HI.  Pt reports feeling stable to d/c today.  No recommendations from CSW.  No further needs voiced by pt.  Pt stable to discharge.     Carmina Miller 02/18/2012, 10:19 AM

## 2012-02-18 NOTE — Progress Notes (Signed)
[facility] Adult Inpatient Family/Significant Other Suicide Prevention Education  Suicide Prevention Education:  Contact Attempts: [name]  - wife [phone]), (name of family member/significant other) has been identified by the patient as the family member/significant other with whom the patient will be residing, and identified as the person(s) who will aid the patient in the event of a mental health crisis.  With written consent from the patient, two attempts were made to provide suicide prevention education, prior to and/or following the patient's discharge.  We were unsuccessful in providing suicide prevention education.  A suicide education pamphlet was given to the patient to share with family/significant other.  Date and time of first attempt:02/17/12 @ 3:00 pm Date and time of second attempt: 02/18/12 @ 10:20 am  Hopkins, Tristan Arnt 02/18/2012, 10:20 AM

## 2012-02-18 NOTE — Tx Team (Signed)
Interdisciplinary Treatment Plan Update (Adult)  Date:  02/18/2012  Time Reviewed:  9:50 AM   Progress in Treatment: Attending groups: Yes Participating in groups:  Yes Taking medication as prescribed: Yes Tolerating medication:  Yes Family/Significant othe contact made:  No Patient understands diagnosis:  Yes Discussing patient identified problems/goals with staff:  Yes Medical problems stabilized or resolved:  Yes Denies suicidal/homicidal ideation: Yes Issues/concerns per patient self-inventory:  None identified Other: N/A  New problem(s) identified: None Identified  Reason for Continuation of Hospitalization: Anxiety Depression Medication stabilization  Interventions implemented related to continuation of hospitalization: Stable to d/c  Additional comments: N/A  Estimated length of stay: D/C today  Discharge Plan: Pt has follow up scheduled at [facility] Outpatient for medication management and therapy.     New goal(s): N/A  Review of initial/current patient goals per problem list:    1.  Goal(s): Address substance use  Met:  Yes  Target date: by discharge  As evidenced by: completing detox protocol and refer to appropriate treatment  2.  Goal (s): Reduce depressive and anxiety symptoms  Met:  Yes  Target date: by discharge  As evidenced by: Reducing depression from a 10 to a 3 as reported by pt.  Pt denies having depression and rates anxiety at a 3 today.   3.  Goal(s): Eliminate SI  Met:  Yes  Target date: by discharge  As evidenced by: pt denying SI   Attendees: Patient:     Family:     Physician: Geoffery Lyons, MD 02/18/2012 9:50 AM   Nursing: Roswell Miners, RN 02/18/2012 9:50 AM   Clinical Social Worker:  Reyes Ivan, LCSWA 02/18/2012  9:50 AM   Other: Chinita Greenland, RN 02/18/2012  9:50 AM   Other:  Nanine Means, NP 02/18/2012 9:52 AM   Other:  Bubba Camp, Psyc intern 02/18/2012 9:52 AM   Other:     Other:      Scribe for  Treatment Team:   Reyes Ivan 02/18/2012 9:50 AM

## 2012-02-23 NOTE — Progress Notes (Signed)
Patient Discharge Instructions:  After Visit Summary (AVS):   Faxed to:  02/23/12 Psychiatric Admission Assessment Note:   Faxed to:  02/23/12 Suicide Risk Assessment - Discharge Assessment:   Faxed to:  02/23/12 Next Level Care Provider Has Access to the EMR, 02/23/12 Records provided to Dr. Christell Constant via [facility] Access Jerelene Redden, 02/23/2012, 2:55 PM

## 2012-02-29 NOTE — Discharge Summary (Signed)
Agree with assessment and plan Taytum Wheller A. Krystopher Kuenzel, M.D. 

## 2012-03-10 ENCOUNTER — Ambulatory Visit (HOSPITAL_COMMUNITY): Payer: Self-pay | Admitting: Licensed Clinical Social Worker

## 2012-03-22 ENCOUNTER — Telehealth: Payer: Self-pay | Admitting: Endocrinology

## 2012-03-22 NOTE — Telephone Encounter (Signed)
Please refill x 1 Ov is due  

## 2012-03-22 NOTE — Telephone Encounter (Signed)
Patient called stating that he was released from the hospital a month ago and they started him back on prozac 20 mg 1poqd and he is completely out and would like a refill sent to [facility]. Please assist.

## 2012-03-23 ENCOUNTER — Other Ambulatory Visit: Payer: Self-pay

## 2012-03-23 ENCOUNTER — Ambulatory Visit (HOSPITAL_COMMUNITY): Payer: Commercial Managed Care - PPO | Admitting: Psychiatry

## 2012-03-23 MED ORDER — FLUOXETINE HCL 20 MG PO CAPS: 20.0000 mg | ORAL_CAPSULE | Freq: Every day | ORAL | Status: DC

## 2012-03-23 NOTE — Telephone Encounter (Signed)
Refilled rx x's 1 left message for pt, ov due

## 2012-03-25 ENCOUNTER — Ambulatory Visit: Payer: Self-pay | Admitting: Endocrinology

## 2012-04-20 ENCOUNTER — Other Ambulatory Visit: Payer: Self-pay

## 2012-04-27 ENCOUNTER — Ambulatory Visit (HOSPITAL_COMMUNITY): Payer: Self-pay | Admitting: Licensed Clinical Social Worker

## 2012-04-29 ENCOUNTER — Other Ambulatory Visit: Payer: Self-pay | Admitting: *Deleted

## 2012-04-30 ENCOUNTER — Encounter (HOSPITAL_COMMUNITY): Payer: Self-pay | Admitting: Psychiatry

## 2012-04-30 ENCOUNTER — Ambulatory Visit (INDEPENDENT_AMBULATORY_CARE_PROVIDER_SITE_OTHER): Payer: Commercial Managed Care - PPO | Admitting: Psychiatry

## 2012-04-30 VITALS — BP 140/95 | HR 84 | Wt 175.0 lb

## 2012-04-30 DIAGNOSIS — F329 Major depressive disorder, single episode, unspecified: Secondary | ICD-10-CM

## 2012-04-30 DIAGNOSIS — F3289 Other specified depressive episodes: Secondary | ICD-10-CM

## 2012-04-30 DIAGNOSIS — F1410 Cocaine abuse, uncomplicated: Secondary | ICD-10-CM

## 2012-04-30 DIAGNOSIS — F1010 Alcohol abuse, uncomplicated: Secondary | ICD-10-CM

## 2012-04-30 MED ORDER — HYDROXYZINE HCL 25 MG PO TABS: 25.0000 mg | ORAL_TABLET | ORAL | Status: DC | PRN

## 2012-04-30 MED ORDER — FLUOXETINE HCL 20 MG PO CAPS: 20.0000 mg | ORAL_CAPSULE | Freq: Every day | ORAL | Status: DC
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
## 2012-05-11 ENCOUNTER — Encounter (HOSPITAL_COMMUNITY): Payer: Self-pay | Admitting: Psychiatry

## 2012-05-11 ENCOUNTER — Ambulatory Visit (INDEPENDENT_AMBULATORY_CARE_PROVIDER_SITE_OTHER): Payer: Commercial Managed Care - PPO | Admitting: Psychiatry

## 2012-05-11 DIAGNOSIS — F1020 Alcohol dependence, uncomplicated: Secondary | ICD-10-CM

## 2012-05-11 DIAGNOSIS — F329 Major depressive disorder, single episode, unspecified: Secondary | ICD-10-CM
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
## 2012-05-31 ENCOUNTER — Other Ambulatory Visit: Payer: Self-pay

## 2012-06-01 ENCOUNTER — Ambulatory Visit (INDEPENDENT_AMBULATORY_CARE_PROVIDER_SITE_OTHER): Payer: Commercial Managed Care - PPO | Admitting: Psychiatry

## 2012-06-01 ENCOUNTER — Telehealth: Payer: Self-pay | Admitting: Endocrinology

## 2012-06-01 ENCOUNTER — Encounter (HOSPITAL_COMMUNITY): Payer: Self-pay | Admitting: Psychiatry

## 2012-06-01 VITALS — BP 128/80 | HR 72 | Wt 179.8 lb

## 2012-06-01 DIAGNOSIS — F1994 Other psychoactive substance use, unspecified with psychoactive substance-induced mood disorder: Secondary | ICD-10-CM

## 2012-06-01 DIAGNOSIS — F1410 Cocaine abuse, uncomplicated: Secondary | ICD-10-CM

## 2012-06-01 DIAGNOSIS — F1010 Alcohol abuse, uncomplicated: Secondary | ICD-10-CM

## 2012-06-01 DIAGNOSIS — F329 Major depressive disorder, single episode, unspecified: Secondary | ICD-10-CM

## 2012-06-01 MED ORDER — LOSARTAN POTASSIUM-HCTZ 100-12.5 MG PO TABS: 1.0000 | ORAL_TABLET | Freq: Every day | ORAL | Status: DC

## 2012-06-01 MED ORDER — FLUOXETINE HCL 20 MG PO CAPS: 20.0000 mg | ORAL_CAPSULE | Freq: Every day | ORAL | Status: DC

## 2012-06-01 NOTE — Telephone Encounter (Signed)
Appt was made for 06/02/12 at 9:30 am.

## 2012-06-01 NOTE — Telephone Encounter (Signed)
The patient called to schedule appt and requested refill of Losartan rx.  The patient states he has been out for two days. The patient may be reached at [phone].

## 2012-06-01 NOTE — Progress Notes (Signed)
[facility] [id number] Progress Note  NHIA HEAPHY [id number] 57 y.o.  06/01/2012 10:13 AM  Chief Complaint:  Medication management and followup.  History of Present Illness: Patient is 57 year old [place] American man who came for his followup appointment.  Patient was seen on January 24th 2014.  Patient is compliant with his Prozac denies any side effects.  He daily takes Vistaril.  He denies any panic attack or any anxiety attack.  He was disappointed as he could not get job.  However his disappointment last only for 2 hours.  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  He sleeping better.  He is going to [employer].  He denies any drinking or using any illegal substance.  He likes his Prozac.  He denies any crying spells, agitation or anger.  He is socializing.  He wants to continue his Prozac.  Suicidal Ideation: No Plan Formed: No Patient has means to carry out plan: No  Homicidal Ideation: No Plan Formed: No Patient has means to carry out plan: No  Review of Systems: Psychiatric: Agitation: No Hallucination: No Depressed Mood: No Insomnia: No Hypersomnia: No Altered Concentration: No Feels Worthless: No Grandiose Ideas: No Belief In Special Powers: No New/Increased Substance Abuse: No Compulsions: No  Neurologic: Headache: No Seizure: No Paresthesias: No  Past psychiatric history. Patient has long history of depression and alcohol problem.  He seeing psychiatrist since [id number] when he was having any R.  He admitted drinking alcohol at age 21.  He endorse heavy drinking and cocaine and he was living in [place].  He was admitted at [facility] in 2007 and then again in 2013.  Both time he was using drugs and having suicidal thoughts.  Patient denies any history of suicidal attempt , mania psychosis or any hallucination.  He had a good response with Prozac.  Medical history. Patient see Dr. Nelwyn Salisbury.   He has hypertension.   Family history. Patient endorse multiple family member has alcohol and drug problem.  Social History:  Patient was born and raised in [place].  He moved to [place] in 1988.  His been married for 19 years.  He has 2 children.  He lives with his wife is been supportive.  History of drug use. Patient endorse history of heavy drinking and cocaine use.  He claims to be sober since release from the hospital in December 2013. Outpatient Encounter Prescriptions as of 06/01/2012  Medication Sig Dispense Refill  . FLUoxetine (PROZAC) 20 MG capsule Take 1 capsule (20 mg total) by mouth daily.  30 capsule  1  . losartan-hydrochlorothiazide (HYZAAR) 100-12.5 MG per tablet Take 1 tablet by mouth daily.  30 tablet  0  . [DISCONTINUED] FLUoxetine (PROZAC) 20 MG capsule Take 1 capsule (20 mg total) by mouth daily.  30 capsule  0  . [DISCONTINUED] hydrOXYzine (ATARAX/VISTARIL) 25 MG tablet Take 1 tablet (25 mg total) by mouth as needed for anxiety.  30 tablet  0   No facility-administered encounter medications on file as of 06/01/2012.    Past Psychiatric History/Hospitalization(s): Anxiety: Yes Bipolar Disorder: No Depression: Yes Mania: No Psychosis: No Schizophrenia: No Personality Disorder: No Hospitalization for psychiatric illness: Yes History of Electroconvulsive Shock Therapy: No Prior Suicide Attempts: No  Physical Exam: Constitutional:  BP 128/80  Pulse 72  Wt 179 lb 12.8 oz (81.557 kg)  BMI 29.03 kg/m2  General Appearance: alert, oriented, no acute distress and well nourished  Musculoskeletal: Strength & Muscle Tone: within normal limits Gait & Station: normal Patient leans: N/A  Psychiatric: Speech (describe rate, volume, coherence, spontaneity, and abnormalities if any): Marland Kitchen  Clear and coherent with normal tone and volume.  Thought Process (describe rate, content, abstract reasoning, and computation): Logical goal-directed.  No flight of ideas or  any loose association.  Associations: Coherent and Relevant  Thoughts: normal  Mental Status: Orientation: oriented to person, place, time/date and situation Mood & Affect: normal affect and anxiety Attention Span & Concentration: Good.  Medical Decision Making (Choose Three): Established Problem, Stable/Improving (1), Review of Psycho-Social Stressors (1), Review of Last Therapy Session (1), Review of Medication Regimen & Side Effects (2) and Review of New Medication or Change in Dosage (2)  Assessment: Axis I: Depressive disorder NOS, alcohol abuse, cocaine abuse, substance-induced mood disorder  Axis II: Deferred  Axis III: Hypertension  Axis IV: Mild  Axis V: 65   Plan: At this time patient is not taking any Vistaril we will discontinue Vistaril.  He will continue Prozac 40 mg at present does.  He is doing better and not using drugs or drinking alcohol.  He was disappointed since he did not get job but he is more involved in volunteer work.  He doesn't have a side effects.  I will continue Prozac at present does.  Recommend to call us if he is any question or concern worsening of the symptom.  I will see him again in 2 months.  Time spent 30 minutes. Portion of this note is generated with voice dictation software and may contain typographical error.  Dearia Wilmouth T., MD 06/01/2012

## 2012-06-01 NOTE — Telephone Encounter (Signed)
Rx was refilled, was appointment made for pt.?

## 2012-06-02 ENCOUNTER — Ambulatory Visit (INDEPENDENT_AMBULATORY_CARE_PROVIDER_SITE_OTHER): Payer: Commercial Managed Care - PPO | Admitting: Endocrinology

## 2012-06-02 VITALS — BP 134/80 | HR 74 | Wt 180.0 lb

## 2012-06-02 DIAGNOSIS — Z79899 Other long term (current) drug therapy: Secondary | ICD-10-CM

## 2012-06-02 DIAGNOSIS — D72819 Decreased white blood cell count, unspecified: Secondary | ICD-10-CM

## 2012-06-02 DIAGNOSIS — I10 Essential (primary) hypertension: Secondary | ICD-10-CM

## 2012-06-02 DIAGNOSIS — E7800 Pure hypercholesterolemia, unspecified: Secondary | ICD-10-CM

## 2012-06-02 DIAGNOSIS — E119 Type 2 diabetes mellitus without complications: Secondary | ICD-10-CM

## 2012-06-02 DIAGNOSIS — R972 Elevated prostate specific antigen [PSA]: Secondary | ICD-10-CM

## 2012-06-02 LAB — CBC WITH DIFFERENTIAL/PLATELET
Basophils Relative: 0.7 % (ref 0.0–3.0)
Eosinophils Absolute: 0.1 10*3/uL (ref 0.0–0.7)
HCT: 41.5 % (ref 39.0–52.0)
Hemoglobin: 13.3 g/dL (ref 13.0–17.0)
Lymphs Abs: 2.1 10*3/uL (ref 0.7–4.0)
MCHC: 32.2 g/dL (ref 30.0–36.0)
MCV: 72.5 fl — ABNORMAL LOW (ref 78.0–100.0)
Monocytes Absolute: 0.3 10*3/uL (ref 0.1–1.0)
Neutro Abs: 1.9 10*3/uL (ref 1.4–7.7)
Neutrophils Relative %: 42.8 % — ABNORMAL LOW (ref 43.0–77.0)
RBC: 5.72 Mil/uL (ref 4.22–5.81)

## 2012-06-02 LAB — HEPATIC FUNCTION PANEL
ALT: 22 U/L (ref 0–53)
AST: 26 U/L (ref 0–37)
Alkaline Phosphatase: 70 U/L (ref 39–117)
Bilirubin, Direct: 0.1 mg/dL (ref 0.0–0.3)
Total Bilirubin: 0.8 mg/dL (ref 0.3–1.2)
Total Protein: 7.2 g/dL (ref 6.0–8.3)

## 2012-06-02 LAB — BASIC METABOLIC PANEL
Calcium: 9.4 mg/dL (ref 8.4–10.5)
GFR: 123.32 mL/min (ref >60.00)
Glucose, Bld: 94 mg/dL (ref 70–99)
Sodium: 136 mEq/L (ref 135–145)

## 2012-06-02 LAB — URINALYSIS, ROUTINE W REFLEX MICROSCOPIC
Bilirubin Urine: NEGATIVE
Ketones, ur: NEGATIVE
Leukocytes, UA: NEGATIVE
pH: 6.5 (ref 5.0–8.0)

## 2012-06-02 LAB — HEMOGLOBIN A1C: Hgb A1c MFr Bld: 6.3 % (ref 4.6–6.5)

## 2012-06-02 LAB — MICROALBUMIN / CREATININE URINE RATIO
Creatinine,U: 82.4 mg/dL
Microalb Creat Ratio: 5.7 mg/g (ref 0.0–30.0)

## 2012-06-02 NOTE — Patient Instructions (Addendum)
blood tests are being requested for you today.  We'll contact you with results. good diet and exercise habits significanly improve the control of your diabetes.  please let me know if you wish to be referred to a dietician.  high blood sugar is very risky to your health.  you should see an eye doctor every year.  You are at higher than average risk for pneumonia and hepatitis-B.  You should be vaccinated against both.   controlling your blood pressure and cholesterol drastically reduces the damage diabetes does to your body.  this also applies to quitting smoking.  please discuss these with your doctor.  you should take an aspirin every day, unless you have been advised by a doctor not to. Please come in soon for a regular physical.

## 2012-06-02 NOTE — Progress Notes (Signed)
Subjective:    Patient ID: [name], male    DOB: 04-12-1955, 57 y.o.   MRN: [id number]  HPI The state of at least three ongoing medical problems is addressed today, with interval history of each noted here: Pt returns for type 2 DM (dx'ed 2011; known complications; he declines medication).  Denies weight change. Dyslipidemia: enies chest pain. Elevated psa: he denies decreased urinary stream. Past Medical History  Diagnosis Date  . HYPERCHOLESTEROLEMIA 03/10/2008  . LEUKOPENIA, CHRONIC 03/10/2008  . DEPRESSION 11/12/2006  . HYPERTENSION 11/12/2006    Echo (12/09) with EF 65%, no valvular abnormalities  . COUGH DUE TO ACE INHIBITORS 09/25/2008  . ELECTROCARDIOGRAM, ABNORMAL 03/10/2008    Past Surgical History  Procedure Laterality Date  . Stress cardiolite  10/04/2001    History   Social History  . Marital Status: Married    Spouse Name: N/A    Number of Children: N/A  . Years of Education: N/A   Occupational History  . [employer]    Social History Main Topics  . Smoking status: Former Smoker    Quit date: 04/07/2005  . Smokeless tobacco: Not on file  . Alcohol Use: Yes  . Drug Use: Yes    Special: Cocaine     Comment: used yesterday.  Marland Kitchen Sexually Active: Yes   Other Topics Concern  . 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 FLUoxetine (PROZAC) 20 MG capsule Take 1 capsule (20 mg total) by mouth daily.  30 capsule  1  . losartan-hydrochlorothiazide (HYZAAR) 100-12.5 MG per tablet Take 1 tablet by mouth daily.  30 tablet  0   No current facility-administered medications on file prior to visit.    Allergies  Allergen Reactions  . Lisinopril     REACTION: cough    Family History  Problem Relation Age of Onset  . Cancer Mother     Ovarian Cancer  .  Hypertension Father   . Heart disease Neg Hx   . Alcohol abuse Maternal Aunt   . Alcohol abuse Maternal Uncle   . Alcohol abuse Cousin     BP 134/80  Pulse 74  Wt 180 lb (81.647 kg)  BMI 29.07 kg/m2  SpO2 98%    Review of Systems Denies leg swelling and sob    Objective:   Physical Exam VITAL SIGNS:  See vs page.   GENERAL: no distress. Pulses: dorsalis pedis intact bilat.   Feet: no deformity.  no ulcer on the feet.  feet are of normal color and temp.  no edema.  Neuro: sensation is intact to touch on the feet.   Lab Results  Component Value Date   WBC 4.5 06/02/2012   HGB 13.3 06/02/2012   HCT 41.5 06/02/2012   PLT 301.0 06/02/2012   GLUCOSE 94 06/02/2012   CHOL 226* 06/02/2012   TRIG 66.0 06/02/2012   HDL 56.50 06/02/2012   LDLDIRECT 158.8 06/02/2012   LDLCALC 129* 03/06/2008   ALT 22 06/02/2012   AST 26 06/02/2012   NA 136 06/02/2012   K 4.0 06/02/2012   CL 100 06/02/2012   CREATININE 0.8 06/02/2012   BUN 10 06/02/2012   CO2 28 06/02/2012   TSH 0.87 06/02/2012   PSA 4.15* 06/02/2012  HGBA1C 6.3 06/02/2012   MICROALBUR 4.7* 06/02/2012      Assessment & Plan:  Dyslipidemia, needs increased rx Elevated psa, improved DM: well-controlled

## 2012-06-06 MED ORDER — ATORVASTATIN CALCIUM 40 MG PO TABS: 40.0000 mg | ORAL_TABLET | Freq: Every day | ORAL | Status: DC

## 2012-07-09 ENCOUNTER — Other Ambulatory Visit: Payer: Self-pay | Admitting: *Deleted

## 2012-07-09 MED ORDER — LOSARTAN POTASSIUM-HCTZ 100-12.5 MG PO TABS: 1.0000 | ORAL_TABLET | Freq: Every day | ORAL | Status: DC

## 2012-07-30 ENCOUNTER — Encounter (HOSPITAL_COMMUNITY): Payer: Self-pay

## 2012-07-30 ENCOUNTER — Ambulatory Visit (HOSPITAL_COMMUNITY): Payer: Self-pay | Admitting: Psychiatry

## 2012-09-02 ENCOUNTER — Telehealth: Payer: Self-pay | Admitting: Endocrinology

## 2012-12-13 ENCOUNTER — Other Ambulatory Visit: Payer: Self-pay

## 2012-12-13 MED ORDER — LOSARTAN POTASSIUM-HCTZ 100-12.5 MG PO TABS: 1.0000 | ORAL_TABLET | Freq: Every day | ORAL | Status: DC

## 2013-03-16 ENCOUNTER — Ambulatory Visit: Payer: Self-pay | Admitting: Endocrinology

## 2013-03-25 ENCOUNTER — Encounter: Payer: Self-pay | Admitting: Endocrinology

## 2013-03-25 ENCOUNTER — Ambulatory Visit (INDEPENDENT_AMBULATORY_CARE_PROVIDER_SITE_OTHER): Payer: Commercial Managed Care - PPO | Admitting: Endocrinology

## 2013-03-25 ENCOUNTER — Ambulatory Visit: Payer: Self-pay | Admitting: Endocrinology

## 2013-03-25 VITALS — BP 128/90 | HR 78 | Temp 98.0°F | Ht 66.0 in | Wt 183.0 lb

## 2013-03-25 DIAGNOSIS — R972 Elevated prostate specific antigen [PSA]: Secondary | ICD-10-CM

## 2013-03-25 DIAGNOSIS — Z23 Encounter for immunization: Secondary | ICD-10-CM

## 2013-03-25 MED ORDER — TRIAMCINOLONE ACETONIDE 0.1 % EX CREA: 1.0000 "application " | TOPICAL_CREAM | Freq: Three times a day (TID) | CUTANEOUS | Status: DC

## 2013-03-25 NOTE — Patient Instructions (Addendum)
i have sent a prescription to your pharmacy, for a skin cream. Please continue the same blood-pressure medication for now.  Please come back for a regular physical appointment in 3 months.

## 2013-03-25 NOTE — Progress Notes (Signed)
   Subjective:    Patient ID: [name], male    DOB: [birth year], 57 y.o.   MRN: [id number]  HPI Pt states few weeks of moderate itching at the right leg, and assoc rash.   Past Medical History  Diagnosis Date  . HYPERCHOLESTEROLEMIA 03/10/2008  . LEUKOPENIA, CHRONIC 03/10/2008  . DEPRESSION 11/12/2006  . HYPERTENSION 11/12/2006    Echo (12/09) with EF 65%, no valvular abnormalities  . COUGH DUE TO ACE INHIBITORS 09/25/2008  . ELECTROCARDIOGRAM, ABNORMAL 03/10/2008    Past Surgical History  Procedure Laterality Date  . Stress cardiolite  10/04/2001    History   Social History  . Marital Status: Married    Spouse Name: N/A    Number of Children: N/A  . Years of Education: N/A   Occupational History  . [employer]    Social History Main Topics  . Smoking status: Former Smoker    Quit date: 04/07/2005  . Smokeless tobacco: Not on file  . Alcohol Use: Yes  . Drug Use: Yes    Special: Cocaine     Comment: used yesterday.  Marland Kitchen Sexual Activity: Yes   Other Topics Concern  . 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 atorvastatin (LIPITOR) 40 MG tablet Take 1 tablet (40 mg total) by mouth daily.  30 tablet  11  . FLUoxetine (PROZAC) 20 MG capsule Take 1 capsule (20 mg total) by mouth daily.  30 capsule  1  . losartan-hydrochlorothiazide (HYZAAR) 100-12.5 MG per tablet Take 1 tablet by mouth daily.  30 tablet  2   No current facility-administered medications on file prior to visit.    Allergies  Allergen Reactions  . Lisinopril     REACTION: cough    Family History  Problem Relation Age of Onset  . Cancer Mother     Ovarian Cancer  . Hypertension Father   . Heart disease Neg Hx   . Alcohol abuse Maternal Aunt   . Alcohol abuse Maternal Uncle   . Alcohol abuse  Cousin     BP 128/90  Pulse 78  Temp(Src) 98 F (36.7 C) (Oral)  Ht 5\' 6"  (1.676 m)  Wt 183 lb (83.008 kg)  BMI 29.55 kg/m2  SpO2 94%  Review of Systems Denies edema and sob    Objective:   Physical Exam VITAL SIGNS:  See vs page GENERAL: no distress       Assessment & Plan:  Rash, new, uncertain etiology HTN: with ? Of situational component.

## 2013-04-04 ENCOUNTER — Other Ambulatory Visit: Payer: Self-pay

## 2013-04-04 MED ORDER — LOSARTAN POTASSIUM-HCTZ 100-12.5 MG PO TABS: 1.0000 | ORAL_TABLET | Freq: Every day | ORAL | Status: DC

## 2013-04-12 ENCOUNTER — Ambulatory Visit: Payer: Self-pay | Admitting: Endocrinology

## 2013-06-23 ENCOUNTER — Ambulatory Visit: Payer: Self-pay | Admitting: Endocrinology

## 2013-09-08 ENCOUNTER — Other Ambulatory Visit: Payer: Self-pay

## 2013-09-08 MED ORDER — LOSARTAN POTASSIUM-HCTZ 100-12.5 MG PO TABS: 1.0000 | ORAL_TABLET | Freq: Every day | ORAL | Status: DC

## 2013-10-25 ENCOUNTER — Telehealth: Payer: Self-pay

## 2013-10-25 NOTE — Telephone Encounter (Signed)
LVM for pt to call back and schedule CPE with PCP

## 2013-12-16 ENCOUNTER — Ambulatory Visit: Payer: Self-pay | Admitting: Endocrinology

## 2013-12-16 DIAGNOSIS — Z0289 Encounter for other administrative examinations: Secondary | ICD-10-CM

## 2013-12-30 ENCOUNTER — Ambulatory Visit (INDEPENDENT_AMBULATORY_CARE_PROVIDER_SITE_OTHER): Payer: BC Managed Care – PPO | Admitting: Endocrinology

## 2013-12-30 ENCOUNTER — Encounter: Payer: Self-pay | Admitting: Endocrinology

## 2013-12-30 VITALS — BP 132/88 | HR 79 | Temp 98.3°F | Ht 66.0 in | Wt 187.0 lb

## 2013-12-30 DIAGNOSIS — I10 Essential (primary) hypertension: Secondary | ICD-10-CM

## 2013-12-30 DIAGNOSIS — Z23 Encounter for immunization: Secondary | ICD-10-CM

## 2013-12-30 DIAGNOSIS — D72819 Decreased white blood cell count, unspecified: Secondary | ICD-10-CM

## 2013-12-30 DIAGNOSIS — R209 Unspecified disturbances of skin sensation: Secondary | ICD-10-CM

## 2013-12-30 DIAGNOSIS — R200 Anesthesia of skin: Secondary | ICD-10-CM

## 2013-12-30 DIAGNOSIS — Z79899 Other long term (current) drug therapy: Secondary | ICD-10-CM

## 2013-12-30 DIAGNOSIS — Z0000 Encounter for general adult medical examination without abnormal findings: Secondary | ICD-10-CM | POA: Insufficient documentation

## 2013-12-30 DIAGNOSIS — E119 Type 2 diabetes mellitus without complications: Secondary | ICD-10-CM

## 2013-12-30 DIAGNOSIS — R972 Elevated prostate specific antigen [PSA]: Secondary | ICD-10-CM

## 2013-12-30 LAB — HEPATIC FUNCTION PANEL
ALT: 16 U/L (ref 0–53)
AST: 21 U/L (ref 0–37)
Albumin: 4.1 g/dL (ref 3.5–5.2)
Alkaline Phosphatase: 65 U/L (ref 39–117)
Bilirubin, Direct: 0 mg/dL (ref 0.0–0.3)
TOTAL PROTEIN: 7.6 g/dL (ref 6.0–8.3)
Total Bilirubin: 0.6 mg/dL (ref 0.2–1.2)

## 2013-12-30 LAB — CBC WITH DIFFERENTIAL/PLATELET
BASOS PCT: 0.5 % (ref 0.0–3.0)
Basophils Absolute: 0 10*3/uL (ref 0.0–0.1)
EOS PCT: 1.5 % (ref 0.0–5.0)
Eosinophils Absolute: 0.1 10*3/uL (ref 0.0–0.7)
HCT: 42.1 % (ref 39.0–52.0)
Hemoglobin: 13.8 g/dL (ref 13.0–17.0)
LYMPHS PCT: 37.3 % (ref 12.0–46.0)
Lymphs Abs: 2.1 10*3/uL (ref 0.7–4.0)
MCHC: 32.7 g/dL (ref 30.0–36.0)
MCV: 71.3 fl — ABNORMAL LOW (ref 78.0–100.0)
MONO ABS: 0.5 10*3/uL (ref 0.1–1.0)
Monocytes Relative: 9.3 % (ref 3.0–12.0)
NEUTROS PCT: 51.4 % (ref 43.0–77.0)
Neutro Abs: 2.9 10*3/uL (ref 1.4–7.7)
Platelets: 334 10*3/uL (ref 150.0–400.0)
RBC: 5.91 Mil/uL — AB (ref 4.22–5.81)
RDW: 15.5 % (ref 11.5–15.5)
WBC: 5.6 10*3/uL (ref 4.0–10.5)

## 2013-12-30 LAB — TSH: TSH: 0.72 u[IU]/mL (ref 0.35–4.50)

## 2013-12-30 LAB — LIPID PANEL
CHOL/HDL RATIO: 4
Cholesterol: 229 mg/dL — ABNORMAL HIGH (ref 0–200)
HDL: 54.8 mg/dL (ref >39.00)
LDL Cholesterol: 156 mg/dL — ABNORMAL HIGH (ref 0–99)
NONHDL: 174.2
TRIGLYCERIDES: 92 mg/dL (ref 0.0–149.0)
VLDL: 18.4 mg/dL (ref 0.0–40.0)

## 2013-12-30 LAB — URINALYSIS, ROUTINE W REFLEX MICROSCOPIC
Bilirubin Urine: NEGATIVE
HGB URINE DIPSTICK: NEGATIVE
Ketones, ur: NEGATIVE
Leukocytes, UA: NEGATIVE
NITRITE: NEGATIVE
RBC / HPF: NONE SEEN (ref 0–?)
Specific Gravity, Urine: 1.01 (ref 1.000–1.030)
TOTAL PROTEIN, URINE-UPE24: NEGATIVE
UROBILINOGEN UA: 0.2 (ref 0.0–1.0)
Urine Glucose: NEGATIVE
WBC UA: NONE SEEN (ref 0–?)
pH: 7 (ref 5.0–8.0)

## 2013-12-30 LAB — MICROALBUMIN / CREATININE URINE RATIO
Creatinine,U: 94.3 mg/dL
MICROALB UR: 5.6 mg/dL — AB (ref 0.0–1.9)
MICROALB/CREAT RATIO: 5.9 mg/g (ref 0.0–30.0)

## 2013-12-30 LAB — BASIC METABOLIC PANEL
BUN: 11 mg/dL (ref 6–23)
CO2: 29 mEq/L (ref 19–32)
Calcium: 9.2 mg/dL (ref 8.4–10.5)
Chloride: 101 mEq/L (ref 96–112)
Creatinine, Ser: 1 mg/dL (ref 0.4–1.5)
GFR: 102.44 mL/min (ref >60.00)
Glucose, Bld: 88 mg/dL (ref 70–99)
Potassium: 3.7 mEq/L (ref 3.5–5.1)
Sodium: 134 mEq/L — ABNORMAL LOW (ref 135–145)

## 2013-12-30 LAB — HEMOGLOBIN A1C: Hgb A1c MFr Bld: 6.5 % (ref 4.6–6.5)

## 2013-12-30 LAB — PSA: PSA: 5.92 ng/mL — AB (ref 0.10–4.00)

## 2013-12-30 LAB — VITAMIN B12: Vitamin B-12: 729 pg/mL (ref 211–911)

## 2013-12-30 MED ORDER — CLOTRIMAZOLE-BETAMETHASONE 1-0.05 % EX CREA: 1.0000 "application " | TOPICAL_CREAM | Freq: Three times a day (TID) | CUTANEOUS | Status: DC | PRN

## 2013-12-30 NOTE — Patient Instructions (Addendum)
Let's check a nerve-ending test.  you will receive a phone call, about a day and time for an appointment. blood and urine tests are being requested for you today.  We'll contact you with results. Please come back soon for a regular physical. i have sent a prescription to your pharmacy, for a different skin cream.

## 2013-12-30 NOTE — Progress Notes (Signed)
Subjective:    Patient ID: [name], male    DOB: Sep 18, 1955, 58 y.o.   MRN: [id number]  HPI Pt states few weeks of slight numbness of the RUE, worst at the right index finger, and assoc itching. Past Medical History  Diagnosis Date  . HYPERCHOLESTEROLEMIA 03/10/2008  . LEUKOPENIA, CHRONIC 03/10/2008  . DEPRESSION 11/12/2006  . HYPERTENSION 11/12/2006    Echo (12/09) with EF 65%, no valvular abnormalities  . COUGH DUE TO ACE INHIBITORS 09/25/2008  . ELECTROCARDIOGRAM, ABNORMAL 03/10/2008    Past Surgical History  Procedure Laterality Date  . Stress cardiolite  10/04/2001    History   Social History  . Marital Status: Married    Spouse Name: N/A    Number of Children: N/A  . Years of Education: N/A   Occupational History  . [employer]    Social History Main Topics  . Smoking status: Former Smoker    Quit date: 04/07/2005  . Smokeless tobacco: Not on file  . Alcohol Use: Yes  . Drug Use: Yes    Special: Cocaine     Comment: used yesterday.  Marland Kitchen Sexual Activity: Yes   Other Topics Concern  . 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 FLUoxetine (PROZAC) 20 MG capsule Take 1 capsule (20 mg total) by mouth daily.  30 capsule  1  . losartan-hydrochlorothiazide (HYZAAR) 100-12.5 MG per tablet Take 1 tablet by mouth daily.  30 tablet  1   No current facility-administered medications on file prior to visit.    Allergies  Allergen Reactions  . Lisinopril     REACTION: cough    Family History  Problem Relation Age of Onset  . Cancer Mother     Ovarian Cancer  . Hypertension Father   . Heart disease Neg Hx   . Alcohol abuse Maternal Aunt   . Alcohol abuse Maternal Uncle   . Alcohol abuse Cousin     BP 132/88  Pulse 79  Temp(Src) 98.3 F (36.8 C) (Oral)   Ht 5\' 6"  (1.676 m)  Wt 187 lb (84.823 kg)  BMI 30.20 kg/m2  SpO2 97%    Review of Systems Denies rash and RUE pain.    Objective:   Physical Exam VITAL SIGNS:  See vs page GENERAL: no distress RUE:  Radial pulse is intact.  sensation is intact to touch.  Motor is 5/5 Pulses: dorsalis pedis intact bilat.   Feet: no deformity of the feet.  no leg or foot edema Skin:  no ulcer on the feet.  normal color and temp.  Patchy eczematous rash on the feet.  Neuro: sensation is intact to touch on the feet.    Lab Results  Component Value Date   WBC 5.6 12/30/2013   HGB 13.8 12/30/2013   HCT 42.1 12/30/2013   PLT 334.0 12/30/2013   GLUCOSE 88 12/30/2013   CHOL 229* 12/30/2013   TRIG 92.0 12/30/2013   HDL 54.80 12/30/2013   LDLDIRECT 158.8 06/02/2012   LDLCALC 156* 12/30/2013   ALT 16 12/30/2013   AST 21 12/30/2013   NA 134* 12/30/2013   K 3.7 12/30/2013   CL 101 12/30/2013   CREATININE 1.0 12/30/2013   BUN 11 12/30/2013   CO2 29  12/30/2013   TSH 0.72 12/30/2013   PSA 5.92* 12/30/2013   HGBA1C 6.5 12/30/2013   MICROALBUR 5.6* 12/30/2013       Assessment & Plan:  Elevated PSA: stable Dyslipidemia: worse, prob due to noncompliance DM: well-controlled Numbness, new, uncertain etiology Eczema: mild exacerbation.     Patient is advised the following: Patient Instructions  Let's check a nerve-ending test.  you will receive a phone call, about a day and time for an appointment. blood and urine tests are being requested for you today.  We'll contact you with results. Please come back soon for a regular physical. i have sent a prescription to your pharmacy, for a different skin cream.  have sent a prescription to your pharmacy, to refill the lipitor.

## 2013-12-31 MED ORDER — ATORVASTATIN CALCIUM 40 MG PO TABS: 40.0000 mg | ORAL_TABLET | Freq: Every day | ORAL | Status: DC

## 2014-01-02 ENCOUNTER — Telehealth: Payer: Self-pay | Admitting: Endocrinology

## 2014-01-02 NOTE — Addendum Note (Signed)
Addended by: Moody Bruins E on: 01/02/2014 09:49 AM   Modules accepted: Orders

## 2014-01-02 NOTE — Telephone Encounter (Signed)
Patient is returing call, he doesn't what call was about.

## 2014-01-02 NOTE — Telephone Encounter (Signed)
Called pt and advised of lab results. Pt was advised that PSA was slightly high but lower than it was 3 years ago. MD did not give any instructions on PSA. Cholesterol was slightly high, refill for Lipitor sent to pharmacy.

## 2014-01-08 ENCOUNTER — Other Ambulatory Visit: Payer: Self-pay | Admitting: Endocrinology

## 2014-04-24 ENCOUNTER — Other Ambulatory Visit: Payer: Self-pay | Admitting: Endocrinology

## 2014-07-10 ENCOUNTER — Other Ambulatory Visit: Payer: Self-pay | Admitting: Endocrinology

## 2014-12-17 ENCOUNTER — Other Ambulatory Visit: Payer: Self-pay | Admitting: Endocrinology

## 2015-01-16 ENCOUNTER — Other Ambulatory Visit: Payer: Self-pay | Admitting: Endocrinology

## 2015-04-17 ENCOUNTER — Telehealth: Payer: Self-pay | Admitting: Endocrinology

## 2015-04-17 ENCOUNTER — Other Ambulatory Visit: Payer: Self-pay | Admitting: Endocrinology

## 2015-04-17 NOTE — Telephone Encounter (Signed)
Please refill x 1 Ov is due  

## 2015-04-17 NOTE — Telephone Encounter (Signed)
[facility] on [address] needs rx for pt's losartan

## 2015-04-17 NOTE — Telephone Encounter (Signed)
See note below and please advise if ok to refill. Last office visit was 12/30/2013.

## 2015-04-18 MED ORDER — LOSARTAN POTASSIUM-HCTZ 100-12.5 MG PO TABS: 1.0000 | ORAL_TABLET | Freq: Every day | ORAL | Status: DC

## 2015-04-18 NOTE — Telephone Encounter (Signed)
Rx sent and appointment letter mailed.  

## 2015-05-14 ENCOUNTER — Other Ambulatory Visit: Payer: Self-pay | Admitting: Endocrinology

## 2015-05-23 ENCOUNTER — Telehealth: Payer: Self-pay | Admitting: Endocrinology

## 2015-05-23 ENCOUNTER — Ambulatory Visit: Payer: Self-pay | Admitting: Endocrinology

## 2015-05-23 MED ORDER — LOSARTAN POTASSIUM-HCTZ 100-12.5 MG PO TABS: 1.0000 | ORAL_TABLET | Freq: Every day | ORAL | Status: DC

## 2015-05-23 NOTE — Telephone Encounter (Signed)
Please refill x 1 Ov is due  

## 2015-05-23 NOTE — Telephone Encounter (Signed)
Pt scheduled for 06/08/2015.

## 2015-05-23 NOTE — Telephone Encounter (Signed)
Patient need a refill of his Losartan.

## 2015-06-08 ENCOUNTER — Encounter: Payer: Self-pay | Admitting: Endocrinology

## 2015-06-08 ENCOUNTER — Telehealth: Payer: Self-pay | Admitting: Endocrinology

## 2015-06-08 ENCOUNTER — Ambulatory Visit (INDEPENDENT_AMBULATORY_CARE_PROVIDER_SITE_OTHER): Payer: BLUE CROSS/BLUE SHIELD | Admitting: Endocrinology

## 2015-06-08 ENCOUNTER — Ambulatory Visit
Admission: RE | Admit: 2015-06-08 | Discharge: 2015-06-08 | Disposition: A | Discharge status: Home / Self Care | Payer: BLUE CROSS/BLUE SHIELD | Source: Ambulatory Visit | Attending: Endocrinology | Admitting: Endocrinology

## 2015-06-08 VITALS — BP 126/84 | HR 85 | Temp 98.7°F | Ht 66.0 in | Wt 189.0 lb

## 2015-06-08 DIAGNOSIS — M545 Low back pain, unspecified: Secondary | ICD-10-CM

## 2015-06-08 DIAGNOSIS — M25551 Pain in right hip: Secondary | ICD-10-CM

## 2015-06-08 DIAGNOSIS — Z0001 Encounter for general adult medical examination with abnormal findings: Secondary | ICD-10-CM

## 2015-06-08 DIAGNOSIS — R972 Elevated prostate specific antigen [PSA]: Secondary | ICD-10-CM | POA: Diagnosis not present

## 2015-06-08 DIAGNOSIS — Z23 Encounter for immunization: Secondary | ICD-10-CM | POA: Diagnosis not present

## 2015-06-08 DIAGNOSIS — Z0000 Encounter for general adult medical examination without abnormal findings: Secondary | ICD-10-CM

## 2015-06-08 DIAGNOSIS — E119 Type 2 diabetes mellitus without complications: Secondary | ICD-10-CM | POA: Diagnosis not present

## 2015-06-08 DIAGNOSIS — M25559 Pain in unspecified hip: Secondary | ICD-10-CM | POA: Insufficient documentation

## 2015-06-08 LAB — PSA: PSA: 7.05 ng/mL — AB (ref 0.10–4.00)

## 2015-06-08 LAB — LIPID PANEL
CHOL/HDL RATIO: 4
Cholesterol: 233 mg/dL — ABNORMAL HIGH (ref 0–200)
HDL: 60.1 mg/dL (ref >39.00)
LDL CALC: 150 mg/dL — AB (ref 0–99)
NONHDL: 173.21
Triglycerides: 118 mg/dL (ref 0.0–149.0)
VLDL: 23.6 mg/dL (ref 0.0–40.0)

## 2015-06-08 LAB — MICROALBUMIN / CREATININE URINE RATIO
Creatinine,U: 126.3 mg/dL
MICROALB/CREAT RATIO: 3.9 mg/g (ref 0.0–30.0)
Microalb, Ur: 4.9 mg/dL — ABNORMAL HIGH (ref 0.0–1.9)

## 2015-06-08 LAB — HEPATIC FUNCTION PANEL
ALT: 19 U/L (ref 0–53)
AST: 19 U/L (ref 0–37)
Albumin: 4.4 g/dL (ref 3.5–5.2)
Alkaline Phosphatase: 60 U/L (ref 39–117)
BILIRUBIN DIRECT: 0.1 mg/dL (ref 0.0–0.3)
BILIRUBIN TOTAL: 0.6 mg/dL (ref 0.2–1.2)
Total Protein: 7.4 g/dL (ref 6.0–8.3)

## 2015-06-08 LAB — CBC WITH DIFFERENTIAL/PLATELET
Basophils Absolute: 0 10*3/uL (ref 0.0–0.1)
Basophils Relative: 0.2 % (ref 0.0–3.0)
EOS PCT: 1.8 % (ref 0.0–5.0)
Eosinophils Absolute: 0.1 10*3/uL (ref 0.0–0.7)
HEMATOCRIT: 41.8 % (ref 39.0–52.0)
HEMOGLOBIN: 13.6 g/dL (ref 13.0–17.0)
LYMPHS PCT: 47.2 % — AB (ref 12.0–46.0)
Lymphs Abs: 2.2 10*3/uL (ref 0.7–4.0)
MCHC: 32.6 g/dL (ref 30.0–36.0)
MCV: 71.4 fl — ABNORMAL LOW (ref 78.0–100.0)
MONO ABS: 0.4 10*3/uL (ref 0.1–1.0)
Monocytes Relative: 7.8 % (ref 3.0–12.0)
Neutro Abs: 2 10*3/uL (ref 1.4–7.7)
Neutrophils Relative %: 43 % (ref 43.0–77.0)
Platelets: 308 10*3/uL (ref 150.0–400.0)
RBC: 5.85 Mil/uL — AB (ref 4.22–5.81)
RDW: 15.6 % — ABNORMAL HIGH (ref 11.5–15.5)
WBC: 4.6 10*3/uL (ref 4.0–10.5)

## 2015-06-08 LAB — URINALYSIS, ROUTINE W REFLEX MICROSCOPIC
Bilirubin Urine: NEGATIVE
Hgb urine dipstick: NEGATIVE
KETONES UR: NEGATIVE
LEUKOCYTES UA: NEGATIVE
Nitrite: NEGATIVE
PH: 7 (ref 5.0–8.0)
SPECIFIC GRAVITY, URINE: 1.015 (ref 1.000–1.030)
TOTAL PROTEIN, URINE-UPE24: NEGATIVE
URINE GLUCOSE: NEGATIVE
UROBILINOGEN UA: 0.2 (ref 0.0–1.0)

## 2015-06-08 LAB — BASIC METABOLIC PANEL
BUN: 10 mg/dL (ref 6–23)
CALCIUM: 9.9 mg/dL (ref 8.4–10.5)
CHLORIDE: 99 meq/L (ref 96–112)
CO2: 32 meq/L (ref 19–32)
Creatinine, Ser: 0.9 mg/dL (ref 0.40–1.50)
GFR: 111.13 mL/min (ref >60.00)
GLUCOSE: 95 mg/dL (ref 70–99)
Potassium: 4 mEq/L (ref 3.5–5.1)
Sodium: 139 mEq/L (ref 135–145)

## 2015-06-08 LAB — TSH: TSH: 0.67 u[IU]/mL (ref 0.35–4.50)

## 2015-06-08 MED ORDER — OMEPRAZOLE 20 MG PO CPDR: 20.0000 mg | DELAYED_RELEASE_CAPSULE | Freq: Every day | ORAL | Status: DC

## 2015-06-08 MED ORDER — TRIAMCINOLONE ACETONIDE 0.1 % EX CREA: 1.0000 "application " | TOPICAL_CREAM | Freq: Three times a day (TID) | CUTANEOUS | Status: DC

## 2015-06-08 MED ORDER — LOSARTAN POTASSIUM-HCTZ 100-12.5 MG PO TABS: 1.0000 | ORAL_TABLET | Freq: Every day | ORAL | Status: DC

## 2015-06-08 MED ORDER — NAPROXEN 250 MG PO TABS: 250.0000 mg | ORAL_TABLET | Freq: Two times a day (BID) | ORAL | Status: DC

## 2015-06-08 NOTE — Patient Instructions (Addendum)
i have sent a prescription to your pharmacy, to refill the skin cream blood tests and x-rays are requested for you today.  We'll let you know about the results. please consider these measures for your health:  minimize alcohol.  do not use tobacco products.  have a colonoscopy at least every 10 years from age 59.  keep firearms safely stored.  always use seat belts.  have working smoke alarms in your home.  see an eye doctor and dentist regularly.  never drive under the influence of alcohol or drugs (including prescription drugs).   it is critically important to prevent falling down (keep floor areas well-lit, dry, and free of loose objects.  If you have a cane, walker, or wheelchair, you should use it, even for short trips around the house.  Wear flat-soled shoes.  Also, try not to rush) Please return in 1 year.

## 2015-06-08 NOTE — Progress Notes (Signed)
Subjective:    Patient ID: [name], male    DOB: Feb 15, 1956, 60 y.o.   MRN: [id number]  HPI Pt is here for regular wellness examination, and is feeling pretty well in general, and says chronic med probs are stable, except as noted below Past Medical History  Diagnosis Date  . HYPERCHOLESTEROLEMIA 03/10/2008  . LEUKOPENIA, CHRONIC 03/10/2008  . DEPRESSION 11/12/2006  . HYPERTENSION 11/12/2006    Echo (12/09) with EF 65%, no valvular abnormalities  . COUGH DUE TO ACE INHIBITORS 09/25/2008  . ELECTROCARDIOGRAM, ABNORMAL 03/10/2008    Past Surgical History  Procedure Laterality Date  . Stress cardiolite  10/04/2001    Social History   Social History  . Marital Status: Married    Spouse Name: N/A  . Number of Children: N/A  . Years of Education: N/A   Occupational History  . [employer]    Social History Main Topics  . Smoking status: Former Smoker    Quit date: 04/07/2005  . Smokeless tobacco: Not on file  . Alcohol Use: Yes  . Drug Use: Yes    Special: Cocaine     Comment: used yesterday.  Marland Kitchen Sexual Activity: Yes   Other Topics Concern  . 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 clotrimazole-betamethasone (LOTRISONE) cream Apply 1 application topically 3 (three) times daily as needed. For rash 45 g 3  . FLUoxetine (PROZAC) 20 MG capsule Take 1 capsule (20 mg total) by mouth daily. 30 capsule 1   No current facility-administered medications on file prior to visit.    Allergies  Allergen Reactions  . Lisinopril     REACTION: cough    Family History  Problem Relation Age of Onset  . Cancer Mother     Ovarian Cancer  . Hypertension Father   . Heart disease Neg Hx   . Alcohol abuse Maternal Aunt   . Alcohol abuse Maternal Uncle   . Alcohol abuse Cousin      BP 126/84 mmHg  Pulse 85  Temp(Src) 98.7 F (37.1 C) (Oral)  Ht 5\' 6"  (1.676 m)  Wt 189 lb (85.73 kg)  BMI 30.52 kg/m2  SpO2 95%   Review of Systems  Constitutional: Negative for fever.  HENT: Negative for hearing loss.   Eyes: Negative for visual disturbance.  Respiratory: Negative for shortness of breath.   Cardiovascular: Negative for chest pain.  Gastrointestinal: Negative for anal bleeding.  Endocrine: Negative for cold intolerance.  Genitourinary: Negative for hematuria and difficulty urinating.  Musculoskeletal: Negative for gait problem.  Skin: Negative for wound.  Allergic/Immunologic: Negative for environmental allergies.  Neurological: Negative for numbness.  Hematological: Does not bruise/bleed easily.  Psychiatric/Behavioral: Negative for dysphoric mood.       Objective:   Physical Exam VS: see vs page GEN: no distress HEAD: head: no deformity eyes: no periorbital swelling, no proptosis external nose and ears are normal mouth: no lesion seen NECK: supple, thyroid is not enlarged CHEST WALL: no deformity LUNGS: clear to auscultation BREASTS:  No gynecomastia CV: reg rate and rhythm, no murmur ABD: abdomen is soft, nontender.  no hepatosplenomegaly.  not distended.  no hernia RECTAL: normal external and internal exam.  heme neg. PROSTATE:  Normal size.  No nodule  MUSCULOSKELETAL: muscle bulk and strength are grossly normal.  no obvious joint swelling.  gait is normal and steady EXTEMITIES: no deformity.  no ulcer on the feet.  feet are of normal color and temp.  no edema PULSES: dorsalis pedis intact bilat.  no carotid bruit NEURO:  cn 2-12 grossly intact.   readily moves all 4's.  sensation is intact to touch on the feet SKIN:  Normal texture and temperature.     NODES:  None palpable at the neck PSYCH: alert, well-oriented.  Does not appear anxious nor depressed.     Assessment & Plan:  Wellness visit today, with problems stable, except as  noted.   SEPARATE EVALUATION FOLLOWS--EACH PROBLEM HERE IS NEW, NOT RESPONDING TO TREATMENT, OR POSES SIGNIFICANT RISK TO THE PATIENT'S HEALTH: HISTORY OF THE PRESENT ILLNESS:  Pt states few weeks of moderate pain at the lower back, but no assoc urinary retention.   PAST MEDICAL HISTORY reviewed and up to date today.   REVIEW OF SYSTEMS: No bowel retention.  Leg rash has recurred PHYSICAL EXAMINATION: VITAL SIGNS:  See vs page GENERAL: no distress. Pelvis: nontender.   Skin: 8x8 cm area of severe eczema on the right leg, proximal to the lateral malleolus LAB/XRAY RESULTS: Lab Results  Component Value Date   PSA 7.05* 06/08/2015   PSA 5.92* 12/30/2013   PSA 4.15* 06/02/2012   Lab Results  Component Value Date   CHOL 233* 06/08/2015   HDL 60.10 06/08/2015   LDLCALC 150* 06/08/2015   LDLDIRECT 158.8 06/02/2012   TRIG 118.0 06/08/2015   CHOLHDL 4 06/08/2015   i personally reviewed electrocardiogram tracing (today): Indication: HTN Impression: Voltage criteria for LVH  (S(V1)+R(V6) exceeds 3.50 mV)  -Voltage criteria w/o ST/T abnormality may be normal IMPRESSION: Low-back/pelvic pain, new, uncertain etiology Leg rash, recurrent, uncertain etiology Elevated PSA, worse Dyslipidemia, therapy limited by noncompliance.  PLAN:  Check x-rays i have sent a prescription to your pharmacy, to refill TAC cream and lipitor Ref urol

## 2015-06-08 NOTE — Progress Notes (Signed)
we discussed code status.  pt requests full code, but would not want to be started or maintained on artificial life-support measures if there was not a reasonable chance of recovery 

## 2015-06-08 NOTE — Telephone Encounter (Signed)
please call GI When is next colonoscopy due?

## 2015-06-09 LAB — HIV ANTIBODY (ROUTINE TESTING W REFLEX): HIV 1&2 Ab, 4th Generation: NONREACTIVE

## 2015-06-09 LAB — HEPATITIS C ANTIBODY: HCV AB: NEGATIVE

## 2015-06-09 MED ORDER — ATORVASTATIN CALCIUM 40 MG PO TABS: ORAL_TABLET | ORAL | Status: DC

## 2015-06-11 NOTE — Telephone Encounter (Signed)
please call patient: Please see a specialist to set up the colonoscopy.  you will receive a phone call, about a day and time for an appointment

## 2015-06-11 NOTE — Telephone Encounter (Signed)
Pt advised of note below via voicemail. Requested a call back if the pt would like to discuss. 

## 2015-06-11 NOTE — Telephone Encounter (Signed)
I contacted GI and they had no record of the pt ever having a colonoscopy with them.

## 2015-07-06 ENCOUNTER — Encounter: Payer: Self-pay | Admitting: Gastroenterology

## 2015-07-31 ENCOUNTER — Telehealth: Payer: Self-pay | Admitting: Endocrinology

## 2015-07-31 NOTE — Telephone Encounter (Signed)
Patient stated he's is having side effects naproxen left shoulder upper back. He said when he takes ibuprofen it goes away. Please advise

## 2015-07-31 NOTE — Telephone Encounter (Signed)
Either pill is fine with me, but would you rather see a specialist? Please let me know

## 2015-07-31 NOTE — Telephone Encounter (Signed)
Left a vm advising of note below. Requested a call back to determine if the pt would like to see a specialist.

## 2015-07-31 NOTE — Telephone Encounter (Signed)
See note below and please advise, Thanks! 

## 2015-08-01 NOTE — Telephone Encounter (Signed)
Attempted to reach the pt. Pt was unavailable. Will try again at a later time.  

## 2015-08-01 NOTE — Telephone Encounter (Signed)
Requested a call back from the pt.  

## 2015-08-02 NOTE — Telephone Encounter (Signed)
I contacted the pt and advised of note below. Pt stated at this time he will continue to take the ibuprofen and will call us if he would like to see the specialist.

## 2015-08-13 ENCOUNTER — Telehealth: Payer: Self-pay | Admitting: Endocrinology

## 2015-08-13 DIAGNOSIS — M25519 Pain in unspecified shoulder: Secondary | ICD-10-CM

## 2015-08-13 DIAGNOSIS — M25512 Pain in left shoulder: Secondary | ICD-10-CM | POA: Insufficient documentation

## 2015-08-13 NOTE — Telephone Encounter (Signed)
Pt advised referral has been placed.  

## 2015-08-13 NOTE — Telephone Encounter (Signed)
See note below. Pt is requesting a referral to a specialist about his shoulder pain.

## 2015-08-13 NOTE — Telephone Encounter (Signed)
done

## 2015-08-13 NOTE — Telephone Encounter (Signed)
PT called and said that he is still in pain and was thinking he was supposed to have Dr. Loanne Drilling refer him to another specialist, but he has not heard anything about it.

## 2015-08-15 ENCOUNTER — Telehealth: Payer: Self-pay | Admitting: Endocrinology

## 2015-08-15 NOTE — Telephone Encounter (Signed)
I contacted the pt and advised Dr. Loanne Drilling has placed the referral to Sports Medicine to be evaluated for his shoulder pain. Pt voiced understanding and stated he would let us know if he hasn't heard back on the referral by Friday (5/12/20170

## 2015-08-15 NOTE — Telephone Encounter (Signed)
PT called and said he was returning your call

## 2015-08-27 ENCOUNTER — Ambulatory Visit (AMBULATORY_SURGERY_CENTER): Payer: Self-pay | Admitting: *Deleted

## 2015-08-27 VITALS — Ht 66.0 in | Wt 192.0 lb

## 2015-08-27 DIAGNOSIS — Z1211 Encounter for screening for malignant neoplasm of colon: Secondary | ICD-10-CM

## 2015-08-27 MED ORDER — NA SULFATE-K SULFATE-MG SULF 17.5-3.13-1.6 GM/177ML PO SOLN: 1.0000 | Freq: Once | ORAL | Status: DC

## 2015-08-27 NOTE — Progress Notes (Signed)
No egg or soy allergy known to patient  No past sedation with any surgeries  or procedures, no  Past intubation No diet pills per patient No home 02 use per patient  No blood thinners per patient  Pt denies issues with constipation  emmi video to e mail

## 2015-08-29 ENCOUNTER — Encounter: Payer: Self-pay | Admitting: Gastroenterology

## 2015-08-30 ENCOUNTER — Encounter: Payer: Self-pay | Admitting: Family Medicine

## 2015-08-30 ENCOUNTER — Ambulatory Visit (INDEPENDENT_AMBULATORY_CARE_PROVIDER_SITE_OTHER): Payer: BLUE CROSS/BLUE SHIELD | Admitting: Family Medicine

## 2015-08-30 ENCOUNTER — Other Ambulatory Visit: Payer: Self-pay

## 2015-08-30 VITALS — BP 152/100 | HR 74 | Ht 66.0 in | Wt 195.0 lb

## 2015-08-30 DIAGNOSIS — M160 Bilateral primary osteoarthritis of hip: Secondary | ICD-10-CM | POA: Insufficient documentation

## 2015-08-30 DIAGNOSIS — M5412 Radiculopathy, cervical region: Secondary | ICD-10-CM | POA: Diagnosis not present

## 2015-08-30 DIAGNOSIS — M25512 Pain in left shoulder: Secondary | ICD-10-CM

## 2015-08-30 DIAGNOSIS — M51369 Other intervertebral disc degeneration, lumbar region without mention of lumbar back pain or lower extremity pain: Secondary | ICD-10-CM | POA: Insufficient documentation

## 2015-08-30 DIAGNOSIS — M129 Arthropathy, unspecified: Secondary | ICD-10-CM | POA: Diagnosis not present

## 2015-08-30 DIAGNOSIS — M5136 Other intervertebral disc degeneration, lumbar region: Secondary | ICD-10-CM | POA: Diagnosis not present

## 2015-08-30 MED ORDER — GABAPENTIN 100 MG PO CAPS: 200.0000 mg | ORAL_CAPSULE | Freq: Every day | ORAL | Status: DC

## 2015-08-30 MED ORDER — PREDNISONE 50 MG PO TABS: 50.0000 mg | ORAL_TABLET | Freq: Every day | ORAL | Status: DC

## 2015-08-30 NOTE — Progress Notes (Signed)
[facility] [place], [place] [address] Phone: [phone] Subjective:    I'm seeing this patient by the request  of: Renato Shin, MD   CC: left shoulder pain, bilateral hip pain  [id number] [name] is a 60 y.o. male coming in with complaint of left shoulder pain and bilateral hip pain.  Regarding patient's left shoulder pain. Seems to be worse. Describes it as a dull, throbbing aching sensation. Seems to radiate down the arm. An states that he can do any movement with the shoulder and knees but unfortunately the next can be severe. Patient states that it seems to radiate down his back sometimes as well. Patient states that it is stopping certain activities. Waking him up at night. Rates the severity of 8 out of 10. Response only minorly to anti-inflammatories. Not remember any true injury.  Patient is also had lower back and bilateral hip pain. States that with walking it seems to give him more discomfort. Hip pain can be in the groin or on the lateral aspect. States that walking long distances can be more painful. Seems to get better when he wakes up in the morning. Maybe some stiffness but then at the end of the day is significantly sore or painful. Rates the severity of pain as 5 out of 10. Patient did have x-rays at an outside facility. X-rays were independently visualized by me.moderate arthritic changes of the lumbar spine as well as hips bilaterally. Denies any radiation down the legs and denies any weakness.     Past Medical History  Diagnosis Date  . HYPERCHOLESTEROLEMIA 03/10/2008  . LEUKOPENIA, CHRONIC 03/10/2008  . DEPRESSION 11/12/2006  . HYPERTENSION 11/12/2006    Echo (12/09) with EF 65%, no valvular abnormalities  . COUGH DUE TO ACE INHIBITORS 09/25/2008  . ELECTROCARDIOGRAM, ABNORMAL 03/10/2008   Past Surgical History  Procedure Laterality Date  . Stress cardiolite  10/04/2001   Social History   Social History  .  Marital Status: Married    Spouse Name: N/A  . Number of Children: N/A  . Years of Education: N/A   Occupational History  . [employer]    Social History Main Topics  . Smoking status: Former Smoker    Quit date: 04/07/2005  . Smokeless tobacco: Never Used  . Alcohol Use: No  . Drug Use: No     Comment: used yesterday.  Marland Kitchen Sexual Activity: Yes   Other Topics Concern  . 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 Lisinopril     REACTION: cough   Family History  Problem Relation Age of Onset  . Cancer Mother     Ovarian Cancer  . Ovarian cancer Mother   . Hypertension Father   . Heart disease Neg Hx   . Colon cancer Neg Hx   . Colon polyps Neg Hx   . Esophageal cancer Neg Hx   . Rectal cancer Neg Hx   . Stomach cancer Neg Hx   . Alcohol abuse Maternal Aunt   . Alcohol abuse Maternal Uncle   . Alcohol abuse Cousin     Past medical history, social, surgical and family history all reviewed in electronic medical record.  No pertanent information unless stated regarding to the chief complaint.   Review of Systems: No headache,  visual changes, nausea, vomiting, diarrhea, constipation, dizziness, abdominal pain, skin rash, fevers, chills, night sweats, weight loss, swollen lymph nodes, body aches, joint swelling, muscle aches, chest pain, shortness of breath, mood changes.   Objective Blood pressure 152/100, pulse 74, height 5\' 6"  (1.676 m), weight 195 lb (88.451 kg), SpO2 99 %.  General: No apparent distress alert and oriented x3 mood and affect normal, dressed appropriately.  HEENT: Pupils equal, extraocular movements intact  Respiratory: Patient's speak in full sentences and does not appear short of breath  Cardiovascular: No lower extremity edema, non tender, no erythema  Skin: Warm dry intact with no signs of infection or rash on  extremities or on axial skeleton.  Abdomen: Soft nontender  Neuro: Cranial nerves II through XII are intact, neurovascularly intact in all extremities with 2+ DTRs and 2+ pulses.  Lymph: No lymphadenopathy of posterior or anterior cervical chain or axillae bilaterally.  Gait antalgic gait MSK:  Non tender with full range of motion and good stability and symmetric strength and tone of , elbows, wrist, , knee and ankles bilaterally. Arthritic changes of multiple joints Neck: Inspection unremarkable. No palpable stepoffs. Positive Spurling's on left. Mini range of motion lacking 10 of extension as well as 10 of side bending bilaterally. Grip strength and sensation normal in bilateral hands Strength good C4 to T1 distribution No sensory change to C4 to T1 Negative Hoffman sign bilaterally Reflexes normal  Shoulder:left Inspection reveals no abnormalities, atrophy or asymmetry. Palpation is normal with no tenderness over AC joint or bicipital groove. ROM is full in all planes. Rotator cuff strength normal throughout. No signs of impingement with negative Neer and Hawkin's tests, empty can sign. Speeds and Yergason's tests normal. No labral pathology noted with negative Obrien's, negative clunk and good stability. Normal scapular function observed. No painful arc and no drop arm sign. No apprehension sign  Back exam shows the patient does lack the last 10 of side bending bilaterally as well as 5 of rotation bilaterally. Extension of only 5. Tenderness in the paraspinal musculature of the lumbar spine. Negative straight leg test. Positive Faber test bilaterally. Patient does have pain with internal rotation of the hips bilaterally. 4 out of 5 strength but symmetric. Deep tendon reflexes intact.   MSK US performed of: left shoulder This study was ordered, performed, and interpreted by Charlann Boxer D.O.  Shoulder:   Supraspinatus:  Appears normal on long and transverse views, no bursal  bulge seen with shoulder abduction on impingement view. Infraspinatus:  Appears normal on long and transverse views. Subscapularis:  Appears normal on long and transverse views. Teres Minor:  Appears normal on long and transverse views. AC joint:  Capsule undistended, no geyser sign. Glenohumeral Joint:  Appears normal without effusion. Glenoid Labrum:  Intact without visualized tears. Biceps Tendon:  Appears normal on long and transverse views, no fraying of tendon, tendon located in intertubercular groove, no subluxation with shoulder internal or external rotation. No increased power doppler signal. Impression: Normal shoulder ultrasound  Procedure note [id number]; 15 minutes spent for Therapeutic exercises as stated in above notes.  This included exercises focusing on stretching, strengthening, with significant focus on eccentric aspects.  Low back exercises that included:  Pelvic tilt/bracing instruction to focus on control of the pelvic girdle and lower abdominal muscles  Glute strengthening exercises, focusing on proper firing of the glutes without engaging the low back muscles Proper stretching techniques for maximum relief for the hamstrings, hip flexors, low back and some rotation where  tolerated   Proper technique shown and discussed handout in great detail with ATC.  All questions were discussed and answered.     Impression and Recommendations:     This case required medical decision making of moderate complexity.      Note: This dictation was prepared with Dragon dictation along with smaller phrase technology. Any transcriptional errors that result from this process are unintentional.

## 2015-08-30 NOTE — Assessment & Plan Note (Signed)
Moderate arthritic changes noted. We discussed icing regimen and home exercises. Discussed which activities to do in which ones to avoid. Patient come back and see me again in 3-4 weeks for further evaluation.

## 2015-08-30 NOTE — Assessment & Plan Note (Signed)
She does have lumbar degenerative disc disease. Seems to be mostly at L5-S1. Could be contributing to some of the problems. No positive straight leg test. I do believe the patient does have some mild weakness of the lower extremity. Does have advanced arthritic changes for patient's age. Does have a history of alcohol abuse that could've potentially contributed. Otherwise we may want to consider further workup for autoimmune diseases if continuing to have difficult he. Patient given range of motion exercises and core strengthening instability. Patient come back and see me again in 3-4 weeks to make sure responding. Otherwise formal physical therapy could be beneficial.

## 2015-08-30 NOTE — Patient Instructions (Addendum)
Nice to meet you  Ice 20 minutes 2 times daily. Usually after activity and before bed. Exercises 3 times a week.  Prednisone daily for 5 days  Gabapentin 200mg  at night Vitamin D 2000 IU daily  Iron 65mg  daily with 500mg  of vitamin C, if cause constipation go down to 3 times a week See me again in 4 weeks and if not better we may need to consider other labs and possible imaging.

## 2015-08-30 NOTE — Progress Notes (Signed)
Pre visit review using our clinic review tool, if applicable. No additional management support is needed unless otherwise documented below in the visit note. 

## 2015-08-30 NOTE — Assessment & Plan Note (Signed)
I believe the patient since pain is cervical radicular.. X-rays are pending. I do believe that patient's shoulder on ultrasound is completely unremarkable. Patient was given gabapentin as well as a low dose of prednisone. Patient will monitor any type of side effects. Encourage patient to do more of an icing regimen, given topical anti-inflammatories a try. Patient's given some home exercises as well. Patient will come back and see me again in 3 weeks for further evaluation. If continuing have pain or radicular symptoms or any weakness we may need to consider advanced imaging or formal physical therapy.

## 2015-09-10 ENCOUNTER — Encounter: Payer: Self-pay | Admitting: Gastroenterology

## 2015-09-10 ENCOUNTER — Ambulatory Visit (AMBULATORY_SURGERY_CENTER): Payer: BLUE CROSS/BLUE SHIELD | Admitting: Gastroenterology

## 2015-09-10 VITALS — BP 150/99 | HR 73 | Temp 98.4°F | Resp 20 | Ht 66.0 in | Wt 192.0 lb

## 2015-09-10 DIAGNOSIS — D123 Benign neoplasm of transverse colon: Secondary | ICD-10-CM | POA: Diagnosis not present

## 2015-09-10 DIAGNOSIS — Z1211 Encounter for screening for malignant neoplasm of colon: Secondary | ICD-10-CM | POA: Diagnosis present

## 2015-09-10 DIAGNOSIS — D125 Benign neoplasm of sigmoid colon: Secondary | ICD-10-CM

## 2015-09-10 DIAGNOSIS — D120 Benign neoplasm of cecum: Secondary | ICD-10-CM | POA: Diagnosis not present

## 2015-09-10 MED ORDER — SODIUM CHLORIDE 0.9 % IV SOLN: 500.0000 mL | INTRAVENOUS | Status: DC

## 2015-09-10 NOTE — Progress Notes (Signed)
Called to room to assist during endoscopic procedure.  Patient ID and intended procedure confirmed with present staff. Received instructions for my participation in the procedure from the performing physician.  

## 2015-09-10 NOTE — Op Note (Signed)
[facility] Patient Name: [name] Procedure Date: 09/10/2015 10:13 AM MRN: [id number] Endoscopist: Remo Lipps P. Havery Moros , MD Age: 60 Referring MD:  Date of Birth: 1956-01-05 Gender: Male Procedure:                Colonoscopy Indications:              Screening for malignant neoplasm in the colon, This                            is the patient's first colonoscopy Medicines:                Monitored Anesthesia Care Procedure:                Pre-Anesthesia Assessment:                           - Prior to the procedure, a History and Physical                            was performed, and patient medications and                            allergies were reviewed. The patient's tolerance of                            previous anesthesia was also reviewed. The risks                            and benefits of the procedure and the sedation                            options and risks were discussed with the patient.                            All questions were answered, and informed consent                            was obtained. Prior Anticoagulants: The patient has                            taken no previous anticoagulant or antiplatelet                            agents. ASA Grade Assessment: II - A patient with                            mild systemic disease. After reviewing the risks                            and benefits, the patient was deemed in                            satisfactory condition to undergo the procedure.  After obtaining informed consent, the colonoscope                            was passed under direct vision. Throughout the                            procedure, the patient's blood pressure, pulse, and                            oxygen saturations were monitored continuously. The                            Model CF-HQ190L [id number]) scope was introduced                            through the anus and advanced to the the  cecum,                            identified by appendiceal orifice and ileocecal                            valve. The colonoscopy was performed without                            difficulty. The patient tolerated the procedure                            well. The quality of the bowel preparation was                            adequate. The ileocecal valve, appendiceal orifice,                            and rectum were photographed. Scope In: 10:28:46 AM Scope Out: 10:48:03 AM Scope Withdrawal Time: 0 hours 15 minutes 46 seconds  Total Procedure Duration: 0 hours 19 minutes 17 seconds  Findings:                 The perianal and digital rectal examinations were                            normal.                           A diminutive polyp was found in the cecum. The                            polyp was sessile. The polyp was removed with a                            cold biopsy forceps. Resection and retrieval were                            complete.  A 3 mm polyp was found in the transverse colon. The                            polyp was sessile. The polyp was removed with a                            cold biopsy forceps. Resection and retrieval were                            complete.                           A 3 mm polyp was found in the sigmoid colon. The                            polyp was sessile. The polyp was removed with a                            cold biopsy forceps. Resection and retrieval were                            complete.                           A few small-mouthed diverticula were found in the                            right colon.                           The entire examined colon appeared normal on direct                            and retroflexion views. Complications:            No immediate complications. Estimated blood loss:                            Minimal. Estimated Blood Loss:     Estimated blood loss was  minimal. Impression:               - One diminutive polyp in the cecum, removed with a                            cold biopsy forceps. Resected and retrieved.                           - One 3 mm polyp in the transverse colon, removed                            with a cold biopsy forceps. Resected and retrieved.                           - One 3 mm polyp in the sigmoid colon, removed with  a cold biopsy forceps. Resected and retrieved.                           - Diverticulosis in the right colon.                           - The entire examined colon is normal on direct and                            retroflexion views. Recommendation:           - Patient has a contact number available for                            emergencies. The signs and symptoms of potential                            delayed complications were discussed with the                            patient. Return to normal activities tomorrow.                            Written discharge instructions were provided to the                            patient.                           - Resume previous diet.                           - Continue present medications.                           - Await pathology results.                           - Repeat colonoscopy is recommended for                            surveillance. The colonoscopy date will be                            determined after pathology results from today's                            exam become available for review. Remo Lipps P. Armbruster, MD 09/10/2015 10:51:21 AM This report has been signed electronically.

## 2015-09-10 NOTE — Patient Instructions (Signed)
YOU HAD AN ENDOSCOPIC PROCEDURE TODAY AT THE [facility]:   Refer to the procedure report that was given to you for any specific questions about what was found during the examination.  If the procedure report does not answer your questions, please call your gastroenterologist to clarify.  If you requested that your care partner not be given the details of your procedure findings, then the procedure report has been included in a sealed envelope for you to review at your convenience later.  YOU SHOULD EXPECT: Some feelings of bloating in the abdomen. Passage of more gas than usual.  Walking can help get rid of the air that was put into your GI tract during the procedure and reduce the bloating. If you had a lower endoscopy (such as a colonoscopy or flexible sigmoidoscopy) you may notice spotting of blood in your stool or on the toilet paper. If you underwent a bowel prep for your procedure, you may not have a normal bowel movement for a few days.  Please Note:  You might notice some irritation and congestion in your nose or some drainage.  This is from the oxygen used during your procedure.  There is no need for concern and it should clear up in a day or so.  SYMPTOMS TO REPORT IMMEDIATELY:   Following lower endoscopy (colonoscopy or flexible sigmoidoscopy):  Excessive amounts of blood in the stool  Significant tenderness or worsening of abdominal pains  Swelling of the abdomen that is new, acute  Fever of 100F or higher  For urgent or emergent issues, a gastroenterologist can be reached at any hour by calling [phone].   DIET: Your first meal following the procedure should be a small meal and then it is ok to progress to your normal diet. Heavy or fried foods are harder to digest and may make you feel nauseous or bloated.  Likewise, meals heavy in dairy and vegetables can increase bloating.  Drink plenty of fluids but you should avoid alcoholic beverages for 24  hours.  ACTIVITY:  You should plan to take it easy for the rest of today and you should NOT DRIVE or use heavy machinery until tomorrow (because of the sedation medicines used during the test).    FOLLOW UP: Our staff will call the number listed on your records the next business day following your procedure to check on you and address any questions or concerns that you may have regarding the information given to you following your procedure. If we do not reach you, we will leave a message.  However, if you are feeling well and you are not experiencing any problems, there is no need to return our call.  We will assume that you have returned to your regular daily activities without incident.  If any biopsies were taken you will be contacted by phone or by letter within the next 1-3 weeks.  Please call us at [phone] if you have not heard about the biopsies in 3 weeks.    SIGNATURES/CONFIDENTIALITY: You and/or your care partner have signed paperwork which will be entered into your electronic medical record.  These signatures attest to the fact that that the information above on your After Visit Summary has been reviewed and is understood.  Full responsibility of the confidentiality of this discharge information lies with you and/or your care-partner.    Handouts were given to your care partner on polyps, diverticulosis, and a high fiber diet with liberal fluid intake. You may resume your   current medications today. Await biopsy results. Please call if any questions or concerns.   

## 2015-09-10 NOTE — Progress Notes (Signed)
No problems noted in the recovery room. maw 

## 2015-09-10 NOTE — Progress Notes (Signed)
Report given to PACU RN, vss 

## 2015-09-11 ENCOUNTER — Telehealth: Payer: Self-pay

## 2015-09-11 NOTE — Telephone Encounter (Signed)
  Follow up Call-  Call back number 09/10/2015  Post procedure Call Back phone  # [phone]  Permission to leave phone message Yes    Patient was called for follow up after his procedure on 09/10/2015. No answer at the number given for follow up phone call. A message was left on the answering machine.

## 2015-09-13 ENCOUNTER — Encounter: Payer: Self-pay | Admitting: Gastroenterology

## 2015-09-24 ENCOUNTER — Ambulatory Visit (INDEPENDENT_AMBULATORY_CARE_PROVIDER_SITE_OTHER)
Admission: RE | Admit: 2015-09-24 | Discharge: 2015-09-24 | Disposition: A | Discharge status: Home / Self Care | Payer: BLUE CROSS/BLUE SHIELD | Source: Ambulatory Visit | Attending: Family Medicine | Admitting: Family Medicine

## 2015-09-24 ENCOUNTER — Encounter: Payer: Self-pay | Admitting: Family Medicine

## 2015-09-24 ENCOUNTER — Ambulatory Visit (INDEPENDENT_AMBULATORY_CARE_PROVIDER_SITE_OTHER): Payer: BLUE CROSS/BLUE SHIELD | Admitting: Family Medicine

## 2015-09-24 VITALS — BP 142/78 | HR 72 | Wt 195.0 lb

## 2015-09-24 DIAGNOSIS — M25519 Pain in unspecified shoulder: Secondary | ICD-10-CM

## 2015-09-24 DIAGNOSIS — M5136 Other intervertebral disc degeneration, lumbar region: Secondary | ICD-10-CM

## 2015-09-24 DIAGNOSIS — M5412 Radiculopathy, cervical region: Secondary | ICD-10-CM | POA: Diagnosis not present

## 2015-09-24 DIAGNOSIS — M129 Arthropathy, unspecified: Secondary | ICD-10-CM

## 2015-09-24 DIAGNOSIS — M160 Bilateral primary osteoarthritis of hip: Secondary | ICD-10-CM

## 2015-09-24 MED ORDER — GABAPENTIN 300 MG PO CAPS: 300.0000 mg | ORAL_CAPSULE | Freq: Every day | ORAL | Status: DC

## 2015-09-24 NOTE — Assessment & Plan Note (Signed)
Patient is doing much better at this time. I would like to increase patient's gabapentin 300 mg at night. New prescription sent in today. We discussed icing regimen and continuing the postural. Decline formal physical therapy. Patient will follow-up with me again in 6 weeks.

## 2015-09-24 NOTE — Progress Notes (Signed)
[name] [address] [place], [place] [address] Phone: [phone] Subjective:    I'm seeing this patient by the request  of: Renato Shin, MD   CC: left shoulder pain, bilateral hip pain  f/u  [id number] [name] is a 60 y.o. male coming in with complaint of left shoulder pain and bilateral hip pain.  Regarding patient's left shoulder pain. Patient was seen previously and there was a concern for patient having more of a cervical radiculopathy with a positive Spurling's. Patient was given gabapentin as well as prednisone. Patient states doing about 70% better. Still mild though aching pain and left shoulder. Significant a better. Not as much pain at night. Feels like gabapentin has been helpful. When he was on the prednisone he had no pain whatsoever.  Patient is also had lower back and bilateral hip pain. Patient was having pain that was concerning for potential spinal stenosis. Patient did have bilateral hip joint arthritis. In addition of this patient didn't have a lumbar x-ray showing severe osteoarthritic changes especially of the sacroiliac joints. Patient was also given gabapentin and prednisone for this as well. Patient states when he is on the prednisone was feeling significantly better. States that he still has some back stiffness. Continues to remain active. Has been able to go to the gym on a regular basis and this has helped as well.     Past Medical History  Diagnosis Date  . HYPERCHOLESTEROLEMIA 03/10/2008  . LEUKOPENIA, CHRONIC 03/10/2008  . DEPRESSION 11/12/2006  . HYPERTENSION 11/12/2006    Echo (12/09) with EF 65%, no valvular abnormalities  . COUGH DUE TO ACE INHIBITORS 09/25/2008  . ELECTROCARDIOGRAM, ABNORMAL 03/10/2008   Past Surgical History  Procedure Laterality Date  . Stress cardiolite  10/04/2001   Social History   Social History  . Marital Status: Married    Spouse Name: N/A  . Number of Children: N/A  . Years  of Education: N/A   Occupational History  . [employer]    Social History Main Topics  . Smoking status: Former Smoker    Quit date: 04/07/2005  . Smokeless tobacco: Never Used  . Alcohol Use: No  . Drug Use: No     Comment: used yesterday.  Marland Kitchen Sexual Activity: Yes   Other Topics Concern  . 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 Lisinopril     REACTION: cough   Family History  Problem Relation Age of Onset  . Cancer Mother     Ovarian Cancer  . Ovarian cancer Mother   . Hypertension Father   . Heart disease Neg Hx   . Colon cancer Neg Hx   . Colon polyps Neg Hx   . Esophageal cancer Neg Hx   . Rectal cancer Neg Hx   . Stomach cancer Neg Hx   . Alcohol abuse Maternal Aunt   . Alcohol abuse Maternal Uncle   . Alcohol abuse Cousin     Past medical history, social, surgical and family history all reviewed in electronic medical record.  No pertanent information unless stated regarding to the chief complaint.   Review of Systems: No headache, visual changes, nausea, vomiting, diarrhea, constipation, dizziness, abdominal pain, skin rash, fevers, chills, night sweats, weight loss, swollen lymph nodes, body aches, joint swelling, muscle  aches, chest pain, shortness of breath, mood changes.   Objective Blood pressure 142/78, pulse 72, weight 195 lb (88.451 kg).  General: No apparent distress alert and oriented x3 mood and affect normal, dressed appropriately.  HEENT: Pupils equal, extraocular movements intact  Respiratory: Patient's speak in full sentences and does not appear short of breath  Cardiovascular: No lower extremity edema, non tender, no erythema  Skin: Warm dry intact with no signs of infection or rash on extremities or on axial skeleton.  Abdomen: Soft nontender  Neuro: Cranial nerves II through XII are intact,  neurovascularly intact in all extremities with 2+ DTRs and 2+ pulses.  Lymph: No lymphadenopathy of posterior or anterior cervical chain or axillae bilaterally.  Gait antalgic gait MSK:  Non tender with full range of motion and good stability and symmetric strength and tone of , elbows, wrist, , knee and ankles bilaterally. Arthritic changes of multiple joints Neck: Inspection unremarkable. No palpable stepoffs. Negative Spurling's Mini range of motion lacking 10 of extension as well as 10 of side bending bilaterally. Grip strength and sensation normal in bilateral hands Strength good C4 to T1 distribution No sensory change to C4 to T1 Negative Hoffman sign bilaterally Reflexes normal  Shoulder:left Inspection reveals no abnormalities, atrophy or asymmetry. Palpation is normal with no tenderness over AC joint or bicipital groove. ROM is full in all planes. Rotator cuff strength normal throughout. No signs of impingement with negative Neer and Hawkin's tests, empty can sign. Speeds and Yergason's tests normal. No labral pathology noted with negative Obrien's, negative clunk and good stability. Normal scapular function observed. No painful arc and no drop arm sign. No apprehension sign  Back exam shows the patient does lack the last 10 of side bending bilaterally as well as 5 of rotation bilaterally. Extension of only 5. Tenderness in the paraspinal musculature of the lumbar spine. Negative straight leg test. Continued Positive Faber test bilaterally. Improvement with range of motion of the hip by 5 with less pain with internal rotation . Deep tendon reflexes intact.     Impression and Recommendations:     This case required medical decision making of moderate complexity.      Note: This dictation was prepared with Dragon dictation along with smaller phrase technology. Any transcriptional errors that result from this process are unintentional.

## 2015-09-24 NOTE — Assessment & Plan Note (Signed)
Mild to moderate degenerative disc disease with what appears to be more of a sacroiliitis. We'll continue to monitor as well. Patient has had an elevated PSA in the past and if worsening symptoms I would like patient to have an MRI for further evaluation.

## 2015-09-24 NOTE — Patient Instructions (Signed)
Good to see you  Xray of the neck downstairs just to make sure you are doing well.  Ice is your friend  Stay active and keep working on the exercises Gabapentin 300mg  at night See me again in 6 weeks

## 2015-09-24 NOTE — Assessment & Plan Note (Signed)
We'll continue to monitor closely. Patient does have moderate arthritic changes bilaterally. Once to avoid any surgical intervention.

## 2015-10-29 ENCOUNTER — Encounter: Payer: Self-pay | Admitting: Family Medicine

## 2015-10-29 ENCOUNTER — Ambulatory Visit (INDEPENDENT_AMBULATORY_CARE_PROVIDER_SITE_OTHER): Payer: BLUE CROSS/BLUE SHIELD | Admitting: Family Medicine

## 2015-10-29 VITALS — BP 142/84 | HR 70 | Wt 194.0 lb

## 2015-10-29 DIAGNOSIS — M5136 Other intervertebral disc degeneration, lumbar region: Secondary | ICD-10-CM

## 2015-10-29 DIAGNOSIS — M129 Arthropathy, unspecified: Secondary | ICD-10-CM

## 2015-10-29 DIAGNOSIS — M5412 Radiculopathy, cervical region: Secondary | ICD-10-CM | POA: Diagnosis not present

## 2015-10-29 DIAGNOSIS — M160 Bilateral primary osteoarthritis of hip: Secondary | ICD-10-CM

## 2015-10-29 DIAGNOSIS — M5416 Radiculopathy, lumbar region: Secondary | ICD-10-CM

## 2015-10-29 MED ORDER — GABAPENTIN 100 MG PO CAPS: 200.0000 mg | ORAL_CAPSULE | Freq: Two times a day (BID) | ORAL | 3 refills | Status: DC

## 2015-10-29 MED ORDER — PREDNISONE 50 MG PO TABS: 50.0000 mg | ORAL_TABLET | Freq: Every day | ORAL | 0 refills | Status: DC

## 2015-10-29 MED ORDER — TRAMADOL HCL 50 MG PO TABS: 50.0000 mg | ORAL_TABLET | Freq: Two times a day (BID) | ORAL | 0 refills | Status: DC | PRN

## 2015-10-29 NOTE — Assessment & Plan Note (Signed)
stable °

## 2015-10-29 NOTE — Assessment & Plan Note (Signed)
Patient is having worsening symptoms. Concern for more of a spinal stenosis as well as a right-sided nerve impingement likely more in the facet area. Patient's  worsening symptoms going down the leg. X-rays previously taken did show significant arthritic changes of the back as well as mild to moderate arthritic changes of the hips. Patient's physical exam is not corresponding with hip arthritis at this time. I do feel with patient having some mild weakness of the right side and is affecting all daily activities including asleep he does need to have advanced imaging. MRI is ordered today. Increase patient's gabapentin to 100 mg in morning and afternoon in 300 mg at night. Five-day dose of prednisone also given. After the MRI we will discuss possible epidural injections. Then patient will follow-up with me 2 weeks afterwards.  Spent  25 minutes with patient face-to-face and had greater than 50% of counseling including as described above in assessment and plan.

## 2015-10-29 NOTE — Patient Instructions (Addendum)
Good to see you  Ice is your friend Prednisone daily for 5 days Gabapentin 100mg  in AM, 100mg  in PM and 300mg  at night Tramadol for breakthrough pain and take with a tramadol. We will get MRI of your back and if I see a nerve being pinched we will order the injeciton.  Then see me 2 weeks after the injection.

## 2015-10-29 NOTE — Progress Notes (Signed)
[facility] [place], [place] [address] Phone: [phone] Subjective:    I'm seeing this patient by the request  of: Renato Shin, MD   CC: left shoulder pain, bilateral hip pain  f/u  [id number]  [name] is a 60 y.o. male coming in with complaint of left shoulder pain and bilateral hip pain.  Regarding patient's left shoulder pain. Patient was seen previously and there was a concern for patient having more of a cervical radiculopathy with a positive Spurling's. Patient was given gabapentin as well as prednisone. Patient states doing about 70% better. Patient continued to have some shoulder pain and was given an injection. Patient states much better after the injection. Patient states that he is 95% better. Does have known neck arthritis but seems to be doing relatively well.  Patient is also had lower back and bilateral hip pain. Patient was having pain that was concerning for potential spinal stenosis. Patient did have bilateral hip joint arthritis. In addition of this patient didn't have a lumbar x-ray showing severe osteoarthritic changes especially of the sacroiliac joints. Patient was also given gabapentin and prednisone for this as well. Patient states when he is on the prednisone was feeling significantly better.  Patient has been off of it is having worsening pain. Seems on the right side. Patient states when he tries to stand up straight it seems to get worse. Notices it more as a burning deep sensation in the legs. Denies any groin pain. Difficult to articulate to wear the pain is mostly. Patient states if he lays flat on his back it seems to be more painful as well. Patient has been walking more in a flexed position. Patient is concerned because this is one month since his last exacerbation and this seems to be coming more frequent.denies any chronic numbness but states that sometimes it feels hard to lift his legs. Once again  mostly on the right side    Past Medical History:  Diagnosis Date  . COUGH DUE TO ACE INHIBITORS 09/25/2008  . DEPRESSION 11/12/2006  . ELECTROCARDIOGRAM, ABNORMAL 03/10/2008  . HYPERCHOLESTEROLEMIA 03/10/2008  . HYPERTENSION 11/12/2006   Echo (12/09) with EF 65%, no valvular abnormalities  . LEUKOPENIA, CHRONIC 03/10/2008   Past Surgical History:  Procedure Laterality Date  . stress cardiolite  10/04/2001   Social History   Social History  . Marital status: Married    Spouse name: N/A  . Number of children: N/A  . Years of education: N/A   Occupational History  . [employer]    Social History Main Topics  . Smoking status: Former Smoker    Quit date: 04/07/2005  . Smokeless tobacco: Never Used  . Alcohol use No  . Drug use: No     Comment: used yesterday.  Marland Kitchen Sexual activity: Yes   Other Topics Concern  . 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 Lisinopril     REACTION: cough   Family History  Problem Relation Age of Onset  . Cancer Mother     Ovarian Cancer  . Ovarian cancer Mother   . Hypertension Father   . Heart disease Neg Hx   . Colon cancer Neg Hx   . Colon polyps Neg Hx   . Esophageal cancer Neg Hx   .  Rectal cancer Neg Hx   . Stomach cancer Neg Hx   . Alcohol abuse Maternal Aunt   . Alcohol abuse Maternal Uncle   . Alcohol abuse Cousin     Past medical history, social, surgical and family history all reviewed in electronic medical record.  No pertanent information unless stated regarding to the chief complaint.   Review of Systems: No headache, visual changes, nausea, vomiting, diarrhea, constipation, dizziness, abdominal pain, skin rash, fevers, chills, night sweats, weight loss, swollen lymph nodes, body aches, joint swelling, muscle aches, chest pain, shortness of breath, mood changes.   Objective  Blood  pressure (!) 142/84, pulse 70, weight 194 lb (88 kg).  General: No apparent distress alert and oriented x3 mood and affect normal, dressed appropriately.  HEENT: Pupils equal, extraocular movements intact  Respiratory: Patient's speak in full sentences and does not appear short of breath  Cardiovascular: No lower extremity edema, non tender, no erythema  Skin: Warm dry intact with no signs of infection or rash on extremities or on axial skeleton.  Abdomen: Soft nontender  Neuro: Cranial nerves II through XII are intact, neurovascularly intact in all extremities with 2+ DTRs and 2+ pulses.  Lymph: No lymphadenopathy of posterior or anterior cervical chain or axillae bilaterally.  Gait antalgic gait MSK:  Non tender with full range of motion and good stability and symmetric strength and tone of , elbows, wrist, , knee and ankles bilaterally. Arthritic changes of multiple joints Neck: Inspection unremarkable. No palpable stepoffs. Negative Spurling's Mini range of motion lacking 10 of extension as well as 10 of side bending bilaterally. Grip strength and sensation normal in bilateral hands Strength good C4 to T1 distribution No sensory change to C4 to T1 Negative Hoffman sign bilaterally Reflexes normal  Shoulder:left Inspection reveals no abnormalities, atrophy or asymmetry. Palpation is normal with no tenderness over AC joint or bicipital groove. ROM is full in all planes. Rotator cuff strength normal throughout. No signs of impingement with negative Neer and Hawkin's tests, empty can sign. Speeds and Yergason's tests normal. No labral pathology noted with negative Obrien's, negative clunk and good stability. Normal scapular function observed. No painful arc and no drop arm sign. No apprehension sign  Back exam shows the patient does lack the last 10 of side bending bilaterally as well as 5 of rotation bilaterally. Extension of only 5. With increasing extension patient states  that this seems to radiate down the right leg. Patient does have mild positive straight leg test on the right side as well. Neurovascularly intact distally. Deep tendon reflexes are 2+ and intact. When patient tries to stand up straight worsening pain down the right leg. Better with patient in flexion. Patient is having some weakness on the right side in 4-5 strength compared to the contralateral side.     Impression and Recommendations:     This case required medical decision making of moderate complexity.      Note: This dictation was prepared with Dragon dictation along with smaller phrase technology. Any transcriptional errors that result from this process are unintentional.

## 2015-11-06 ENCOUNTER — Encounter: Payer: Self-pay | Admitting: *Deleted

## 2015-11-06 ENCOUNTER — Telehealth: Payer: Self-pay | Admitting: *Deleted

## 2015-11-06 MED ORDER — PREDNISONE 50 MG PO TABS: 50.0000 mg | ORAL_TABLET | Freq: Every day | ORAL | 0 refills | Status: DC

## 2015-11-06 NOTE — Telephone Encounter (Signed)
Rec'd call pt states he is still having pain wanting to see if he can get refill on the prednisone. Still taking the tramadol &  Gabapentin that was rx but not helping...[name]

## 2015-11-06 NOTE — Telephone Encounter (Signed)
Notified pt w/MD response.../lmb 

## 2015-11-06 NOTE — Telephone Encounter (Signed)
1 more 5 day burst due to patient not having MRI until the 8th.  No NSAIDs with it. Continue the other medicines.  If worsening pain go to ED.

## 2015-11-13 ENCOUNTER — Ambulatory Visit
Admission: RE | Admit: 2015-11-13 | Discharge: 2015-11-13 | Disposition: A | Discharge status: Home / Self Care | Payer: BLUE CROSS/BLUE SHIELD | Source: Ambulatory Visit | Attending: Family Medicine | Admitting: Family Medicine

## 2015-11-13 DIAGNOSIS — M5136 Other intervertebral disc degeneration, lumbar region: Secondary | ICD-10-CM

## 2015-11-14 ENCOUNTER — Other Ambulatory Visit: Payer: Self-pay | Admitting: *Deleted

## 2015-11-14 DIAGNOSIS — M48061 Spinal stenosis, lumbar region without neurogenic claudication: Secondary | ICD-10-CM

## 2015-11-19 ENCOUNTER — Ambulatory Visit
Admission: RE | Admit: 2015-11-19 | Discharge: 2015-11-19 | Disposition: A | Discharge status: Home / Self Care | Payer: BLUE CROSS/BLUE SHIELD | Source: Ambulatory Visit | Attending: Family Medicine | Admitting: Family Medicine

## 2015-11-19 DIAGNOSIS — M48061 Spinal stenosis, lumbar region without neurogenic claudication: Secondary | ICD-10-CM

## 2015-11-19 MED ORDER — IOPAMIDOL (ISOVUE-M 200) INJECTION 41%
1.0000 mL | Freq: Once | INTRAMUSCULAR | Status: AC
  Administered 2015-11-19: 1 mL via EPIDURAL

## 2015-11-19 MED ORDER — METHYLPREDNISOLONE ACETATE 40 MG/ML INJ SUSP (RADIOLOG
120.0000 mg | Freq: Once | INTRAMUSCULAR | Status: AC
  Administered 2015-11-19: 120 mg via EPIDURAL

## 2015-11-19 NOTE — Discharge Instructions (Signed)

## 2015-11-22 ENCOUNTER — Other Ambulatory Visit: Payer: Self-pay | Admitting: Endocrinology

## 2015-12-06 ENCOUNTER — Telehealth: Payer: Self-pay | Admitting: Family Medicine

## 2015-12-06 NOTE — Telephone Encounter (Signed)
We can try an injection in the hip or we can refer him to discuss surgical intervention for his hip arthritis.

## 2015-12-06 NOTE — Telephone Encounter (Signed)
Pt stating she still has pain on his hip even when he is on tramadol. He was wondering what else can he do or take to help with this pain. Please call pt back

## 2015-12-07 MED ORDER — PREDNISONE 50 MG PO TABS: 50.0000 mg | ORAL_TABLET | Freq: Every day | ORAL | 0 refills | Status: DC

## 2015-12-07 NOTE — Telephone Encounter (Addendum)
Pt called back this morning and made appt to be seen on 9/21 at 11 am. He still asked that you call him so he knows what to do for the pain until his visit. Thanks.

## 2015-12-07 NOTE — Telephone Encounter (Signed)
Sent in prednsone for 5 days.  After that if still in pain then increase naproxen to 500mg  twice daily AFTER the prednisone

## 2015-12-11 NOTE — Telephone Encounter (Signed)
lmovm to f/u with pt & make sure he was able to pick up the prednisone dr Tamala Julian sent into his pharmacy.

## 2015-12-13 ENCOUNTER — Other Ambulatory Visit: Payer: Self-pay | Admitting: Family Medicine

## 2015-12-14 MED ORDER — NAPROXEN 500 MG PO TABS: 500.0000 mg | ORAL_TABLET | Freq: Two times a day (BID) | ORAL | 0 refills | Status: DC | PRN

## 2015-12-14 NOTE — Telephone Encounter (Signed)
Medication sent to pharmacy  

## 2015-12-14 NOTE — Addendum Note (Signed)
Addended by: Mauricio Po D on: 12/14/2015 10:20 AM   Modules accepted: Orders

## 2015-12-14 NOTE — Telephone Encounter (Signed)
Notified patient.

## 2015-12-14 NOTE — Telephone Encounter (Signed)
Patient has to work this weekend and want to make sure he has something for pain.

## 2015-12-14 NOTE — Telephone Encounter (Signed)
[name], can you help with this?  Patient states he does not have anymore naproxen.  States he only has gabapentin and has been taking tylenol.  [facility] uses [facility] on [address].

## 2015-12-17 NOTE — Telephone Encounter (Signed)
Refill denied. Pt does not need a refill on prednisone.

## 2015-12-21 ENCOUNTER — Encounter: Payer: Self-pay | Admitting: Internal Medicine

## 2015-12-21 ENCOUNTER — Telehealth: Payer: Self-pay | Admitting: Endocrinology

## 2015-12-21 ENCOUNTER — Ambulatory Visit (INDEPENDENT_AMBULATORY_CARE_PROVIDER_SITE_OTHER): Payer: BLUE CROSS/BLUE SHIELD | Admitting: Internal Medicine

## 2015-12-21 DIAGNOSIS — M5416 Radiculopathy, lumbar region: Secondary | ICD-10-CM

## 2015-12-21 DIAGNOSIS — I10 Essential (primary) hypertension: Secondary | ICD-10-CM | POA: Diagnosis not present

## 2015-12-21 DIAGNOSIS — M5136 Other intervertebral disc degeneration, lumbar region: Secondary | ICD-10-CM | POA: Diagnosis not present

## 2015-12-21 MED ORDER — HYDROCODONE-ACETAMINOPHEN 5-325 MG PO TABS: 1.0000 | ORAL_TABLET | Freq: Four times a day (QID) | ORAL | 0 refills | Status: DC | PRN

## 2015-12-21 MED ORDER — METHYLPREDNISOLONE ACETATE 80 MG/ML IJ SUSP
80.0000 mg | Freq: Once | INTRAMUSCULAR | Status: AC
  Administered 2015-12-21: 80 mg via INTRAMUSCULAR

## 2015-12-21 NOTE — Telephone Encounter (Signed)
Patient Name: [name] DOB: 1955/08/08 Initial Comment Caller states having hard time walking, taking Naproxen for back pain, still having a lot of pain Nurse Assessment Nurse: Vallery Sa, RN, [place] Date/Time (Eastern Time): 12/21/2015 2:08:38 PM Confirm and document reason for call. If symptomatic, describe symptoms. You must click the next button to save text entered. ---Caller states he developed low back pain that goes into his legs about 2 months (rated as a 9 on the 1 to 10 scale). No new injury in the past 3 days. No fever. Alert and responsive. Has the patient traveled out of the country within the last 30 days? ---No Does the patient have any new or worsening symptoms? ---Yes Will a triage be completed? ---Yes Related visit to physician within the last 2 weeks? ---Yes Does the PT have any chronic conditions? (i.e. diabetes, asthma, etc.) ---Yes List chronic conditions. ---High Blood Pressure, Back pain Is this a behavioral health or substance abuse call? ---No Guidelines Guideline Title Affirmed Question Affirmed Notes Back Pain [1] SEVERE back pain (e.g., excruciating, unable to do any normal activities) AND [2] not improved 2 hours after pain medicine Final Disposition User See Physician within 4 Hours (or PCP triage) Vallery Sa, RN, Cathy Comments Scheduled for 4:15pm appointment today, Dr. Nonie Hoyer at the [place] office. Referrals REFERRED TO PCP OFFICE Disagree/Comply: Comply

## 2015-12-21 NOTE — Patient Instructions (Signed)
Limit your sodium (Salt) intake  Take pain medication as directed  Follow-up with Dr. Tamala Julian in 6 days as scheduled  Report any new or worsening symptoms

## 2015-12-21 NOTE — Addendum Note (Signed)
Addended by: Marian Sorrow on: 12/21/2015 05:29 PM   Modules accepted: Orders

## 2015-12-21 NOTE — Progress Notes (Signed)
Subjective:    Patient ID: [name], male    DOB: 1955-04-14, 60 y.o.   MRN: [id number]  HPI 60 year old patient who has a 3 month history of right buttock and right leg pain.  He has had an MRI performed that revealed considerable facet joint disease as well as a right-sided synovial cyst compressing the L5 nerve root.  He was given an epidural on August 14, but improvement lasted only 5 days.  Decompression of the synovial cyst was to be considered if symptoms persisted. He has been on naproxen and gabapentin and still quite uncomfortable.  He does respond briefly to cortisone.  He has had a difficult time sleeping due to the buttock and right leg discomfort.  Denies any weakness. He is scheduled to see sports medicine in 6 days  Past Medical History:  Diagnosis Date  . COUGH DUE TO ACE INHIBITORS 09/25/2008  . DEPRESSION 11/12/2006  . ELECTROCARDIOGRAM, ABNORMAL 03/10/2008  . HYPERCHOLESTEROLEMIA 03/10/2008  . HYPERTENSION 11/12/2006   Echo (12/09) with EF 65%, no valvular abnormalities  . LEUKOPENIA, CHRONIC 03/10/2008     Social History   Social History  . Marital status: Married    Spouse name: N/A  . Number of children: N/A  . Years of education: N/A   Occupational History  . [employer]    Social History Main Topics  . Smoking status: Former Smoker    Quit date: 04/07/2005  . Smokeless tobacco: Never Used  . Alcohol use No  . Drug use: No     Comment: used yesterday.  Marland Kitchen Sexual activity: Yes   Other Topics Concern  . 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 stress cardiolite  10/04/2001    Family History  Problem Relation Age of Onset  . Cancer Mother     Ovarian Cancer  . Ovarian cancer Mother   . Hypertension Father   . Heart disease Neg Hx   . Colon cancer Neg Hx   . Colon polyps Neg Hx    . Esophageal cancer Neg Hx   . Rectal cancer Neg Hx   . Stomach cancer Neg Hx   . Alcohol abuse Maternal Aunt   . Alcohol abuse Maternal Uncle   . Alcohol abuse Cousin     Allergies  Allergen Reactions  . Lisinopril Cough    Current Outpatient Prescriptions on File Prior to Visit  Medication Sig Dispense Refill  . atorvastatin (LIPITOR) 40 MG tablet TAKE 1 TABLET (40 MG TOTAL) BY MOUTH DAILY. 30 tablet 11  . clotrimazole-betamethasone (LOTRISONE) cream Apply 1 application topically 3 (three) times daily as needed. For rash 45 g 3  . gabapentin (NEURONTIN) 300 MG capsule Take 1 capsule (300 mg total) by mouth at bedtime. 90 capsule 1  . losartan-hydrochlorothiazide (HYZAAR) 100-12.5 MG tablet TAKE 1 TABLET BY MOUTH EVERY DAY 30 tablet 3  . naproxen (NAPROSYN) 500 MG tablet Take 1 tablet (500 mg total) by mouth 2 (two) times daily as needed for moderate pain. 60 tablet 0  . omeprazole (PRILOSEC) 20 MG capsule Take 1 capsule (20 mg total) by mouth daily. 30 capsule 3   No current facility-administered medications on file prior to visit.     BP (!) 160/100 (BP Location: Left Arm, Patient Position: Sitting,  Cuff Size: Normal)   Pulse 85   Temp 98.7 F (37.1 C) (Oral)   Resp 20   Ht 5\' 6"  (1.676 m)   Wt 197 lb 8 oz (89.6 kg)   SpO2 98%   BMI 31.88 kg/m      Review of Systems  Constitutional: Negative for appetite change, chills, fatigue and fever.  HENT: Negative for congestion, dental problem, ear pain, hearing loss, sore throat, tinnitus, trouble swallowing and voice change.   Eyes: Negative for pain, discharge and visual disturbance.  Respiratory: Negative for cough, chest tightness, wheezing and stridor.   Cardiovascular: Negative for chest pain, palpitations and leg swelling.  Gastrointestinal: Negative for abdominal distention, abdominal pain, blood in stool, constipation, diarrhea, nausea and vomiting.  Genitourinary: Negative for difficulty urinating, discharge,  flank pain, genital sores, hematuria and urgency.  Musculoskeletal: Positive for back pain. Negative for arthralgias, gait problem, joint swelling, myalgias and neck stiffness.  Skin: Negative for rash.  Neurological: Positive for numbness. Negative for dizziness, syncope, speech difficulty, weakness and headaches.  Hematological: Negative for adenopathy. Does not bruise/bleed easily.  Psychiatric/Behavioral: Negative for behavioral problems and dysphoric mood. The patient is not nervous/anxious.        Objective:   Physical Exam  Constitutional: He appears well-developed and well-nourished. No distress.  Musculoskeletal:   Negative straight leg test Ankle flexion and extension normal          Assessment & Plan:   Right L5 radiculopathy/synovial cyst.  This has been at least temporarily steroid responsive.  Will treat with Depo-Medrol 80.  Follow-up sports medicine in 6 days as scheduled.  At that time, will set up for synovial cyst decompression.  Prescription for Vicodin, dispensed Hypertension.  Suboptimal control today.  We'll reassess next week.  No change in medical regimen  Nyoka Cowden

## 2015-12-21 NOTE — Progress Notes (Signed)
Pre visit review using our clinic review tool, if applicable. No additional management support is needed unless otherwise documented below in the visit note. 

## 2015-12-26 ENCOUNTER — Ambulatory Visit: Payer: BLUE CROSS/BLUE SHIELD | Admitting: Family Medicine

## 2015-12-26 NOTE — Progress Notes (Signed)
[facility] [address] [place], [place] [address] Phone: [phone] Subjective:    I'm seeing this patient by the request  of: Renato Shin, MD   CC: left shoulder pain, bilateral hip pain  f/u  [id number]  [name] is a 60 y.o. male coming in with complaint of left shoulder pain and bilateral hip pain.  Regarding patient's left shoulder pain. Patient was seen previously and there was a concern for patient having more of a cervical radiculopathy with a positive Spurling's. Patient was given gabapentin as well as prednisone. Patient states doing about 70% better. Patient continued to have some shoulder pain and was given an injection. Patient states much better after the injection. Patient stated he is 95% better at last follow-up. Known cervical radiculopathy. Patient states  Still giving him some difficulty. Not as severe as what it was before. Still an aching sensation. No numbness and no radiation down the arm.  Patient is also had lower back and bilateral hip pain. Patient was having pain that was concerning for potential spinal stenosis. Patient did have bilateral hip joint arthritis. In addition of this patient didn't have a lumbar x-ray showing severe osteoarthritic changes especially of the sacroiliac joints. Patient was also given gabapentin and prednisone for this as well. Henden 3 different doses of the prednisone over the course of time. Patient was having worsening pain and was sent for an MRI. MRI was independently visualized by me and did show severe facet arthritis mostly the whole lumbar spine as well as a right sided synovial cyst compressing on the L5 nerve root. Patient elected to have an epidural that was done on August 14. Patient was given 5 days of good relief. Continued on the naproxen as well as the gabapentin even though the pain came back. States that he continues to have pain in the buttocks region as well as the right leg.  Denies any weakness.patient was seen by primary care provider and was given Depo-Medrol 80 mg. Patient also given pain medication.  Patient sates that the pain is becoming more severe. Patient feels that the  Injection never completely resolved all the pain. Patient states that it is starting to affect daily activities. Needs the pain medications fairly regularly to continue to be active though.    Past Medical History:  Diagnosis Date  . COUGH DUE TO ACE INHIBITORS 09/25/2008  . DEPRESSION 11/12/2006  . ELECTROCARDIOGRAM, ABNORMAL 03/10/2008  . HYPERCHOLESTEROLEMIA 03/10/2008  . HYPERTENSION 11/12/2006   Echo (12/09) with EF 65%, no valvular abnormalities  . LEUKOPENIA, CHRONIC 03/10/2008   Past Surgical History:  Procedure Laterality Date  . stress cardiolite  10/04/2001   Social History   Social History  . Marital status: Married    Spouse name: N/A  . Number of children: N/A  . Years of education: N/A   Occupational History  . [employer]    Social History Main Topics  . Smoking status: Former Smoker    Quit date: 04/07/2005  . Smokeless tobacco: Never Used  . Alcohol use No  . Drug use: No     Comment: used yesterday.  Marland Kitchen Sexual activity: Yes   Other Topics Concern  . 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 Lisinopril Cough   Family History  Problem Relation Age of Onset  . Cancer Mother     Ovarian Cancer  . Ovarian cancer Mother   . Hypertension Father   . Heart disease Neg Hx   . Colon cancer Neg Hx   . Colon polyps Neg Hx   . Esophageal cancer Neg Hx   . Rectal cancer Neg Hx   . Stomach cancer Neg Hx   . Alcohol abuse Maternal Aunt   . Alcohol abuse Maternal Uncle   . Alcohol abuse Cousin     Past medical history, social, surgical and family history all reviewed in electronic medical record.  No pertanent  information unless stated regarding to the chief complaint.   Review of Systems: No headache, visual changes, nausea, vomiting, diarrhea, constipation, dizziness, abdominal pain, skin rash, fevers, chills, night sweats, weight loss, swollen lymph nodes,chest pain, shortness of breath, mood changes.   Objective  Blood pressure (!) 140/100, pulse 84, weight 197 lb 12.8 oz (89.7 kg).  General: No apparent distress alert and oriented x3 mood and affect normal, dressed appropriately.  HEENT: Pupils equal, extraocular movements intact  Respiratory: Patient's speak in full sentences and does not appear short of breath  Cardiovascular: No lower extremity edema, non tender, no erythema  Skin: Warm dry intact with no signs of infection or rash on extremities or on axial skeleton.  Abdomen: Soft nontender  Neuro: Cranial nerves II through XII are intact, neurovascularly intact in all extremities with 2+ DTRs and 2+ pulses.  Lymph: No lymphadenopathy of posterior or anterior cervical chain or axillae bilaterally.  Gait antalgic gait  Mild shuffling MSK:  Non tender with full range of motion and good stability and symmetric strength and tone of , elbows, wrist, , knee and ankles bilaterally. Arthritic changes of multiple joints Neck: Inspection unremarkable. No palpable stepoffs. Negative Spurling's Mini range of motion lacking 10 of extension as well as 10 of side bending bilaterally. Grip strength and sensation normal in bilateral hands Strength good C4 to T1 distribution No sensory change to C4 to T1 Negative Hoffman sign bilaterally Reflexes normal  Shoulder:left Inspection reveals no abnormalities, atrophy or asymmetry. Palpation is normal with no tenderness over AC joint or bicipital groove. ROM is full in all planes. Rotator cuff strength normal throughout.  minimal impingement signs noted Speeds and Yergason's tests normal. No labral pathology noted with negative Obrien's, negative  clunk and good stability. Normal scapular function observed. No painful arc and no drop arm sign. No apprehension sign   Contralateral shoulder unremarkable  Back exam shows the patient does lack the last 10 of side bending bilaterally as well as 5 of rotation bilaterally. Extension of only 5.  Worsening left-sided straight leg test. Patient strength is 4 out of 5 and symmetric. He can reflexes are intact. Negative pain to moderate palpation of the tear spinal musculature of the lumbar spine.     Impression and Recommendations:     This case required medical decision making of moderate complexity.      Note: This dictation was prepared with Dragon dictation along with smaller phrase technology. Any transcriptional errors that result from this process are unintentional.

## 2015-12-27 ENCOUNTER — Other Ambulatory Visit: Payer: Self-pay | Admitting: *Deleted

## 2015-12-27 ENCOUNTER — Ambulatory Visit (INDEPENDENT_AMBULATORY_CARE_PROVIDER_SITE_OTHER): Payer: BLUE CROSS/BLUE SHIELD | Admitting: Family Medicine

## 2015-12-27 VITALS — BP 140/100 | HR 84 | Wt 197.8 lb

## 2015-12-27 DIAGNOSIS — M5136 Other intervertebral disc degeneration, lumbar region: Secondary | ICD-10-CM | POA: Diagnosis not present

## 2015-12-27 DIAGNOSIS — M25519 Pain in unspecified shoulder: Secondary | ICD-10-CM | POA: Diagnosis not present

## 2015-12-27 DIAGNOSIS — M5416 Radiculopathy, lumbar region: Secondary | ICD-10-CM | POA: Diagnosis not present

## 2015-12-27 MED ORDER — PREDNISONE 50 MG PO TABS: 50.0000 mg | ORAL_TABLET | Freq: Every day | ORAL | 0 refills | Status: DC

## 2015-12-27 MED ORDER — DICLOFENAC SODIUM 2 % TD SOLN: 2.0000 "application " | Freq: Two times a day (BID) | TRANSDERMAL | 3 refills | Status: DC

## 2015-12-27 NOTE — Patient Instructions (Signed)
Good to see you.  Ice 20 minutes 2 times daily. Usually after activity and before bed. Exercises 3 times a week.  Prednisone daily for 5 days again.  We will try to get the synovial cyst to be drained and see if that helps.  Dr. Maree Erie would likely do it.  Continue the gabapentin  pennsaid pinkie amount topically 2 times daily as needed.  If pain worsens after epidural or shoulder dose not get better then we will do injection.  Either see me 2 weeks after epidural or send me an update in my chart

## 2015-12-27 NOTE — Assessment & Plan Note (Signed)
With patient having radicular symptoms I do think that he can respond well to the epidural steroid injection. I think we will try for aspiration of the synovial cyst. We discussed icing regimen, home exercises, which activities to do in which ones to potentially avoid.  Patient will continue to stay active. Patient was given another 5 day burst of prednisone to see if that will be helpful. Patient knows not to take the anti-inflammatory's with this medication. Also given topical anti-implant towards.

## 2015-12-27 NOTE — Assessment & Plan Note (Signed)
Overall seems to be stable. Mild impingement signs noted. Patient will continue to monitor. Differential includes cervical radiculopathy. Topical anti-inflammatories given

## 2016-01-01 ENCOUNTER — Telehealth: Payer: Self-pay | Admitting: *Deleted

## 2016-01-01 MED ORDER — PREDNISONE 50 MG PO TABS: 50.0000 mg | ORAL_TABLET | Freq: Every day | ORAL | 0 refills | Status: DC

## 2016-01-01 NOTE — Telephone Encounter (Signed)
Pt left msg on triage requesting to speak w/Lindsay. Wanting to get refill on Prednisone...Johny Chess

## 2016-01-01 NOTE — Telephone Encounter (Signed)
Can do one more 5 day dose but that will have to be it.

## 2016-01-01 NOTE — Telephone Encounter (Signed)
Pt has an epidural scheduled 10.6.17

## 2016-01-01 NOTE — Telephone Encounter (Signed)
Pt made aware & rx sent into pharmacy.

## 2016-01-09 ENCOUNTER — Other Ambulatory Visit: Payer: Self-pay | Admitting: Family

## 2016-01-09 NOTE — Telephone Encounter (Signed)
Refill done.  

## 2016-01-11 ENCOUNTER — Ambulatory Visit
Admission: RE | Admit: 2016-01-11 | Discharge: 2016-01-11 | Disposition: A | Discharge status: Home / Self Care | Payer: BLUE CROSS/BLUE SHIELD | Source: Ambulatory Visit | Attending: Family Medicine | Admitting: Family Medicine

## 2016-01-11 VITALS — BP 225/138 | HR 80

## 2016-01-11 DIAGNOSIS — M5416 Radiculopathy, lumbar region: Secondary | ICD-10-CM

## 2016-01-11 DIAGNOSIS — M5136 Other intervertebral disc degeneration, lumbar region: Secondary | ICD-10-CM

## 2016-01-11 MED ORDER — IOPAMIDOL (ISOVUE-M 200) INJECTION 41%
1.0000 mL | Freq: Once | INTRAMUSCULAR | Status: AC
  Administered 2016-01-11: 1 mL via INTRA_ARTICULAR

## 2016-01-11 MED ORDER — METHYLPREDNISOLONE ACETATE 40 MG/ML INJ SUSP (RADIOLOG
120.0000 mg | Freq: Once | INTRAMUSCULAR | Status: AC
  Administered 2016-01-11: 120 mg via INTRA_ARTICULAR

## 2016-01-11 NOTE — Discharge Instructions (Signed)

## 2016-02-06 ENCOUNTER — Other Ambulatory Visit: Payer: Self-pay | Admitting: Endocrinology

## 2016-02-25 ENCOUNTER — Other Ambulatory Visit: Payer: Self-pay | Admitting: Family Medicine

## 2016-02-25 NOTE — Telephone Encounter (Signed)
Refill done.  

## 2016-02-27 ENCOUNTER — Telehealth: Payer: Self-pay | Admitting: Family Medicine

## 2016-02-27 MED ORDER — PREDNISONE 50 MG PO TABS: 50.0000 mg | ORAL_TABLET | Freq: Every day | ORAL | 0 refills | Status: DC

## 2016-02-27 MED ORDER — DICLOFENAC SODIUM 2 % TD SOLN: 2.0000 "application " | Freq: Two times a day (BID) | TRANSDERMAL | 3 refills | Status: DC

## 2016-02-27 NOTE — Telephone Encounter (Signed)
Refilled both.  Pennsaid will be mailed.  Prednisone refilled but he needs to be seen in the next 2 weeks or we will need to consider another nerve root injection.

## 2016-02-27 NOTE — Telephone Encounter (Signed)
Patient is requesting Dr. Tamala Julian to refill prednisone and steroid cream.  [facility] [address].

## 2016-02-27 NOTE — Telephone Encounter (Signed)
lmovm for pt to return call.  

## 2016-02-27 NOTE — Telephone Encounter (Signed)
Pt made aware & is coming in next week to see dr Tamala Julian.

## 2016-03-03 ENCOUNTER — Telehealth: Payer: Self-pay | Admitting: *Deleted

## 2016-03-03 NOTE — Progress Notes (Signed)
[facility] [address] [place], [place] [address] Phone: [phone] Subjective:    I'm seeing this patient by the request  of: Renato Shin, MD   CC: Bilateral hip pain follow-up  [id number]  [name] is a 60 y.o. male coming in with complaint of left shoulder pain and bilateral hip pain.   Patient is also had lower back and bilateral hip pain. Patient was having pain that was concerning for potential spinal stenosis. Patient did have bilateral hip joint arthritis. In addition of this patient didn't have a lumbar x-ray showing severe osteoarthritic changes especially of the sacroiliac joints. Patient was also given gabapentin and prednisone for this as well. Henden 3 different doses of the prednisone over the course of time. Patient was having worsening pain and was sent for an MRI. MRI was independently visualized by me and did show severe facet arthritis mostly the whole lumbar spine as well as a right sided synovial cyst compressing on the L5 nerve root. Patient did have temporary improvement. Patient then had a needle aspiration of the right L4-L5 synovial cyst with local steroid injection done on 01/11/2016. Patient states that this is the best he never felt that his injection. Patient states he is having worsening pain. Patient called me and we did start him on prednisone. Patient states still better than prior to the Synvisc synovial cyst aspiration but is having worsening discomfort at this time. Patient states that the naproxen is not helping.    Past Medical History:  Diagnosis Date  . COUGH DUE TO ACE INHIBITORS 09/25/2008  . DEPRESSION 11/12/2006  . ELECTROCARDIOGRAM, ABNORMAL 03/10/2008  . HYPERCHOLESTEROLEMIA 03/10/2008  . HYPERTENSION 11/12/2006   Echo (12/09) with EF 65%, no valvular abnormalities  . LEUKOPENIA, CHRONIC 03/10/2008   Past Surgical History:  Procedure Laterality Date  . stress cardiolite  10/04/2001   Social History     Social History  . Marital status: Married    Spouse name: N/A  . Number of children: N/A  . Years of education: N/A   Occupational History  . [employer]    Social History Main Topics  . Smoking status: Former Smoker    Quit date: 04/07/2005  . Smokeless tobacco: Never Used  . Alcohol use No  . Drug use: No     Comment: used yesterday.  Marland Kitchen Sexual activity: Yes   Other Topics Concern  . 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 Lisinopril Cough   Family History  Problem Relation Age of Onset  . Cancer Mother     Ovarian Cancer  . Ovarian cancer Mother   . Hypertension Father   . Heart disease Neg Hx   . Colon cancer Neg Hx   . Colon polyps Neg Hx   . Esophageal cancer Neg Hx   . Rectal cancer Neg Hx   . Stomach cancer Neg Hx   . Alcohol abuse Maternal Aunt   . Alcohol abuse Maternal Uncle   . Alcohol abuse Cousin     Past medical history, social, surgical and family history all reviewed in electronic medical record.  No pertanent information unless stated regarding to the chief complaint.   Review of Systems: No headache, visual changes, nausea, vomiting, diarrhea, constipation, dizziness, abdominal pain, skin rash, fevers, chills, night  sweats, weight loss, swollen lymph nodes,chest pain, shortness of breath, mood changes.   Objective  Blood pressure 140/86, pulse 90, height 5\' 6"  (1.676 m), weight 203 lb (92.1 kg), SpO2 97 %.  Systems examined below as of 03/04/16 General: NAD A&O x3 mood, affect normal  HEENT: Pupils equal, extraocular movements intact no nystagmus Respiratory: not short of breath at rest or with speaking Cardiovascular: No lower extremity edema, non tender Skin: Warm dry intact with no signs of infection or rash on extremities or on axial skeleton. Abdomen: Soft nontender, no masses Neuro: Cranial nerves   intact, neurovascularly intact in all extremities with 2+ DTRs and 2+ pulses. Lymph: No lymphadenopathy appreciated today  Gait antalgic gait  continues to walk in a flexed posture MSK:  Non tender with full range of motion and good stability and symmetric strength and tone of , elbows, wrist, , knee and ankles bilaterally. Arthritic changes of multiple joints   Back exam shows the patient does lack the last 10 of side bending bilaterally as well as 5 of rotation bilaterally. Extension of only 5.  Worsening left-sided straight leg test again from previous exam. . Patient strength is 4 out of 5 and symmetric. Distal reflexes intact      Impression and Recommendations:     This case required medical decision making of moderate complexity.      Note: This dictation was prepared with Dragon dictation along with smaller phrase technology. Any transcriptional errors that result from this process are unintentional.

## 2016-03-03 NOTE — Telephone Encounter (Signed)
Left msg on triage stating he is hurting and wanting to get a refill on the prednisone. He has an appt to see Dr. Tamala Julian tomorrow...[name]

## 2016-03-03 NOTE — Telephone Encounter (Signed)
He just completed a course of prednisone within the last week. I would encourage him to be seen again to ensure that it is appropriate. In the meantime, aggressive ice/moist heat and continue his previously prescribed medications.

## 2016-03-04 ENCOUNTER — Encounter: Payer: Self-pay | Admitting: Family Medicine

## 2016-03-04 ENCOUNTER — Ambulatory Visit (INDEPENDENT_AMBULATORY_CARE_PROVIDER_SITE_OTHER): Payer: BLUE CROSS/BLUE SHIELD | Admitting: Family Medicine

## 2016-03-04 VITALS — BP 140/86 | HR 90 | Ht 66.0 in | Wt 203.0 lb

## 2016-03-04 DIAGNOSIS — M7130 Other bursal cyst, unspecified site: Secondary | ICD-10-CM

## 2016-03-04 DIAGNOSIS — M5136 Other intervertebral disc degeneration, lumbar region: Secondary | ICD-10-CM | POA: Diagnosis not present

## 2016-03-04 MED ORDER — MELOXICAM 15 MG PO TABS: 15.0000 mg | ORAL_TABLET | Freq: Every day | ORAL | 0 refills | Status: DC

## 2016-03-04 NOTE — Assessment & Plan Note (Signed)
I do believe the patient's pain is coming from his arthritis as well as the synovial cyst. Patient is having worsening straight leg test again from previous exam which likely could mean than patient is having increasing size of the synovial cyst again. We discussed about the aspiration again and patient was hoping for something that may be a longer improvement. Patient has been referred to neurosurgery. Started on meloxicam. Discussed if any weakening to seek medical attention immediately. Patient will discuss with the neurosurgeon about possible surgical intervention versus possibly another aspiration. Patient will follow-up with me as needed.  Spent  25 minutes with patient face-to-face and had greater than 50% of counseling including as described above in assessment and plan.

## 2016-03-04 NOTE — Patient Instructions (Addendum)
Good to see you  [name] is your friend.  We will stop the naproxen  Start the meloxicam  Send me a message in 1 week and if not helping then we will try aspirating the cyst again In the meantime we will get you in with nuerosurgery as well.  Happy holidays!

## 2016-03-04 NOTE — Telephone Encounter (Signed)
Notified pt w/Greg response. Will see Dr. Tamala Julian today @ 1:45...[name]

## 2016-03-17 ENCOUNTER — Telehealth: Payer: Self-pay | Admitting: Endocrinology

## 2016-03-17 ENCOUNTER — Ambulatory Visit (INDEPENDENT_AMBULATORY_CARE_PROVIDER_SITE_OTHER): Payer: BLUE CROSS/BLUE SHIELD | Admitting: Endocrinology

## 2016-03-17 ENCOUNTER — Encounter: Payer: Self-pay | Admitting: Endocrinology

## 2016-03-17 VITALS — BP 134/82 | HR 85 | Ht 66.0 in | Wt 201.0 lb

## 2016-03-17 DIAGNOSIS — I10 Essential (primary) hypertension: Secondary | ICD-10-CM | POA: Diagnosis not present

## 2016-03-17 MED ORDER — LOSARTAN POTASSIUM-HCTZ 100-25 MG PO TABS: 1.0000 | ORAL_TABLET | Freq: Every day | ORAL | 11 refills | Status: DC

## 2016-03-17 NOTE — Telephone Encounter (Signed)
I attempted to reach the patient. Patient was unavailable and did not have a working Advertising account executive. Will try again at a later time.

## 2016-03-17 NOTE — Patient Instructions (Addendum)
Let's check a 24-HR urine test. I have sent a prescription to your pharmacy, to increase the blood pressure pill.  Please continue to monitor the BP at home. Call if it goes high again.

## 2016-03-17 NOTE — Telephone Encounter (Signed)
See message and please advise, Thanks!  

## 2016-03-17 NOTE — Telephone Encounter (Signed)
I contacted the patient and advised of message. Patient scheduled for 3 pm today.

## 2016-03-17 NOTE — Telephone Encounter (Signed)
Patient is returning your call. Call [phone]

## 2016-03-17 NOTE — Telephone Encounter (Signed)
Ov 3 PM today

## 2016-03-17 NOTE — Progress Notes (Signed)
Subjective:    Patient ID: [name], male    DOB: [birth year], 60 y.o.   MRN: [id number]  HPI Pt says he takes Hyzaar as rx'ed.  Wife monitors BP at home.  Pt says she noted BP over the past week has been high.  this am, it was 190/131.  He has slight excessive diaphoresis throughout the body, but no assoc weight change.  Past Medical History:  Diagnosis Date  . COUGH DUE TO ACE INHIBITORS 09/25/2008  . DEPRESSION 11/12/2006  . ELECTROCARDIOGRAM, ABNORMAL 03/10/2008  . HYPERCHOLESTEROLEMIA 03/10/2008  . HYPERTENSION 11/12/2006   Echo (12/09) with EF 65%, no valvular abnormalities  . LEUKOPENIA, CHRONIC 03/10/2008    Past Surgical History:  Procedure Laterality Date  . stress cardiolite  10/04/2001    Social History   Social History  . Marital status: Married    Spouse name: N/A  . Number of children: N/A  . Years of education: N/A   Occupational History  . [employer]    Social History Main Topics  . Smoking status: Former Smoker    Quit date: 04/07/2005  . Smokeless tobacco: Never Used  . Alcohol use No  . Drug use: No     Comment: used yesterday.  Marland Kitchen Sexual activity: Yes   Other Topics Concern  . 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 atorvastatin (LIPITOR) 40 MG tablet TAKE 1 TABLET (40 MG TOTAL) BY MOUTH DAILY. 30 tablet 11  . clotrimazole-betamethasone (LOTRISONE) cream Apply 1 application topically 3 (three) times daily as needed. For rash 45 g 3  . Diclofenac Sodium (PENNSAID) 2 % SOLN Place 2 application onto the skin 2 (two) times daily. 112 g 3  . gabapentin (NEURONTIN) 100 MG capsule TAKE 2 CAPSULES BY MOUTH TWICE A DAY 60 capsule 5  . gabapentin (NEURONTIN) 300 MG capsule Take 1 capsule (300 mg total) by mouth at bedtime. 90 capsule 1  . meloxicam  (MOBIC) 15 MG tablet Take 1 tablet (15 mg total) by mouth daily. 30 tablet 0  . omeprazole (PRILOSEC) 20 MG capsule TAKE ONE CAPSULE BY MOUTH EVERY DAY 30 capsule 3   No current facility-administered medications on file prior to visit.     Allergies  Allergen Reactions  . Lisinopril Cough    Family History  Problem Relation Age of Onset  . Cancer Mother     Ovarian Cancer  . Ovarian cancer Mother   . Hypertension Father   . Heart disease Neg Hx   . Colon cancer Neg Hx   . Colon polyps Neg Hx   . Esophageal cancer Neg Hx   . Rectal cancer Neg Hx   . Stomach cancer Neg Hx   . Alcohol abuse Maternal Aunt   . Alcohol abuse Maternal Uncle   . Alcohol abuse Cousin     BP 134/82   Pulse 85   Ht 5\' 6"  (1.676 m)   Wt 201 lb (91.2 kg)   SpO2 95%   BMI 32.44 kg/m    Review of Systems Denies headache, anxiety, CP, SOB, vomiting, flushing, and palpitations.     Objective:   Physical Exam VITAL SIGNS:  See vs page GENERAL: no distress LUNGS:  Clear to auscultation. HEART:  Regular rate and rhythm without murmurs noted. Normal S1,S2.   i personally reviewed electrocardiogram tracing (today): Indication: HTN Impression: high voltage  Lab Results  Component Value Date   CREATININE 0.90 06/08/2015   BUN 10 06/08/2015   NA 139 06/08/2015   K 4.0 06/08/2015   CL 99 06/08/2015   CO2 32 06/08/2015      Assessment & Plan:  HTN: worse Excessive diaphoresis, new.   Patient is advised the following: Patient Instructions  Let's check a 24-HR urine test. I have sent a prescription to your pharmacy, to increase the blood pressure pill.  Please continue to monitor the BP at home. Call if it goes high again.

## 2016-03-17 NOTE — Telephone Encounter (Signed)
Can we increase the dose of the BP meds he has been tacking his BP and it has not been going down over the past week. BP 190/131 is the norm

## 2016-03-27 ENCOUNTER — Other Ambulatory Visit: Payer: Self-pay | Admitting: Neurosurgery

## 2016-04-16 ENCOUNTER — Other Ambulatory Visit: Payer: Self-pay

## 2016-04-16 NOTE — Addendum Note (Signed)
Addended by: Kaylyn Lim I on: 04/16/2016 10:13 AM   Modules accepted: Orders

## 2016-04-17 ENCOUNTER — Encounter (HOSPITAL_COMMUNITY): Payer: Self-pay

## 2016-04-17 NOTE — Pre-Procedure Instructions (Signed)
Eliott J Mikowski  04/17/2016      [facility] - [place], [place] - [place] [address] Phone: [phone] Fax: [phone]  [id number] [place], [place] - [address] [place] [address] Phone: [phone] Fax: [phone]  [facility], [place], [place] - [id number] [place] [address] Phone: [phone] Fax: [phone]  [facility], [place] [address] Phone: [phone] Fax: [phone]    Your procedure is scheduled on Friday January 19.  Report to [facility] Admitting at 5:30 A.M.  Call this number if you have problems the morning of surgery:  [phone]   Remember:  Do not eat food or drink liquids after midnight.  Take these medicines the morning of surgery with A SIP OF WATER: flexeril if needed  7 days prior to surgery STOP taking any Aspirin, Meloxicam (mobic), diclofenac, Aleve, Naproxen, Ibuprofen, Motrin, Advil, Goody's, BC's, all herbal medications, fish oil, and all vitamins    Do not wear jewelry, make-up or nail polish.  Do not wear lotions, powders, or perfumes, or deoderant.  Do not shave 48 hours prior to surgery.  Men may shave face and neck.  Do not bring valuables to the hospital.  [facility] is not responsible for any belongings or valuables.  Contacts, dentures or bridgework may not be worn into surgery.  Leave your suitcase in the car.  After surgery it may be brought to your room.  For patients admitted to the hospital, discharge time will be determined by your treatment team.  Patients discharged the day of surgery will not be allowed to drive home.   Special instructions:    [facility]- Preparing For Surgery  Before surgery, you can play an important role. Because  skin is not sterile, your skin needs to be as free of germs as possible. You can reduce the number of germs on your skin by washing with CHG (chlorahexidine gluconate) Soap before surgery.  CHG is an antiseptic cleaner which kills germs and bonds with the skin to continue killing germs even after washing.  Please do not use if you have an allergy to CHG or antibacterial soaps. If your skin becomes reddened/irritated stop using the CHG.  Do not shave (including legs and underarms) for at least 48 hours prior to first CHG shower. It is OK to shave your face.  Please follow these instructions carefully.   1. Shower the NIGHT BEFORE SURGERY and the MORNING OF SURGERY with CHG.   2. If you chose to wash your hair, wash your hair first as usual with your normal shampoo.  3. After you shampoo, rinse your hair and body thoroughly to remove the shampoo.  4. Use CHG as you would any other liquid soap. You can apply CHG directly to the skin and wash gently with a scrungie or a clean washcloth.   5. Apply the CHG Soap to your body ONLY FROM THE NECK DOWN.  Do not use on open wounds or open sores. Avoid contact with your eyes, ears, mouth and genitals (private parts). Wash genitals (private parts) with your normal soap.  6. Wash thoroughly, paying special attention to the area where your surgery will be performed.  7. Thoroughly rinse your body with warm water from  the neck down.  8. DO NOT shower/wash with your normal soap after using and rinsing off the CHG Soap.  9. Pat yourself dry with a CLEAN TOWEL.   10. Wear CLEAN PAJAMAS   11. Place CLEAN SHEETS on your bed the night of your first shower and DO NOT SLEEP WITH PETS.    Day of Surgery: Do not apply any deodorants/lotions. Please wear clean clothes to the hospital/surgery center.      Please read over the following fact sheets that you were given. MRSA Information

## 2016-04-18 ENCOUNTER — Encounter (HOSPITAL_COMMUNITY): Payer: Self-pay

## 2016-04-18 ENCOUNTER — Encounter (HOSPITAL_COMMUNITY)
Admission: RE | Admit: 2016-04-18 | Discharge: 2016-04-18 | Disposition: A | Discharge status: Home / Self Care | Payer: BLUE CROSS/BLUE SHIELD | Source: Ambulatory Visit | Attending: Neurosurgery | Admitting: Neurosurgery

## 2016-04-18 DIAGNOSIS — Z01818 Encounter for other preprocedural examination: Secondary | ICD-10-CM | POA: Insufficient documentation

## 2016-04-18 LAB — CBC
HEMATOCRIT: 46.4 % (ref 39.0–52.0)
HEMOGLOBIN: 14.9 g/dL (ref 13.0–17.0)
MCH: 23.2 pg — ABNORMAL LOW (ref 26.0–34.0)
MCHC: 32.1 g/dL (ref 30.0–36.0)
MCV: 72.2 fL — ABNORMAL LOW (ref 78.0–100.0)
Platelets: 298 10*3/uL (ref 150–400)
RBC: 6.43 MIL/uL — ABNORMAL HIGH (ref 4.22–5.81)
RDW: 14.3 % (ref 11.5–15.5)
WBC: 4.6 10*3/uL (ref 4.0–10.5)

## 2016-04-18 LAB — SURGICAL PCR SCREEN
MRSA, PCR: NEGATIVE
STAPHYLOCOCCUS AUREUS: POSITIVE — AB

## 2016-04-18 MED ORDER — CHLORHEXIDINE GLUCONATE CLOTH 2 % EX PADS: 6.0000 | MEDICATED_PAD | Freq: Once | CUTANEOUS | Status: DC

## 2016-04-18 NOTE — Progress Notes (Signed)
PCR positive for MSSA, negative for MRSA, prescription called to pharmacy and patient notified.

## 2016-04-18 NOTE — Progress Notes (Signed)
PCP: Renato Shin Pt denies cardiac hx or cardiac doctor EKG: 03/17/16  Pt denies chest pain, SOB or signs of infection at PAT appointment. Pt reports hx of stress test in 2003 in [place] somewhere d/t his blood pressure being elevated. Per pt this was normal.

## 2016-04-18 NOTE — Progress Notes (Signed)
BMET sample sent at PAT hemolyzed. Order placed to draw BMET D.O.S.

## 2016-04-23 LAB — CATECHOLAMINES, FRACTIONATED, URINE, 24 HOUR
CALCULATED TOTAL (E+ NE): 112 ug/(24.h) (ref 26–121)
Creatinine, Urine mg/day-CATEUR: 1.76 g/(24.h) (ref 0.63–2.50)
DOPAMINE, 24 HR URINE: 414 ug/(24.h) (ref 52–480)
Epinephrine, 24 hr Urine: 7 mcg/24 h (ref 2–24)
Norepinephrine, 24 hr Ur: 105 mcg/24 h — ABNORMAL HIGH (ref 15–100)
TOTAL VOLUME - CF 24HR U: 1000 mL

## 2016-04-23 LAB — METANEPHRINES, URINE, 24 HOUR
Metaneph Total, Ur: 626 mcg/24 h (ref 224–832)
Metanephrines, Ur: 103 mcg/24 h (ref 90–315)
Normetanephrine, 24H Ur: 523 mcg/24 h (ref 122–676)

## 2016-04-25 ENCOUNTER — Encounter (HOSPITAL_COMMUNITY): Payer: Self-pay | Admitting: *Deleted

## 2016-04-25 ENCOUNTER — Encounter (HOSPITAL_COMMUNITY)
Admission: RE | Disposition: A | Discharge status: Home / Self Care | Payer: Self-pay | Source: Ambulatory Visit | Attending: Neurosurgery

## 2016-04-25 ENCOUNTER — Inpatient Hospital Stay (HOSPITAL_COMMUNITY)
Admission: RE | Admit: 2016-04-25 | Discharge: 2016-04-26 | DRG: 520 | Disposition: A | Discharge status: Home / Self Care | Payer: BLUE CROSS/BLUE SHIELD | Source: Ambulatory Visit | Attending: Neurosurgery | Admitting: Neurosurgery

## 2016-04-25 ENCOUNTER — Inpatient Hospital Stay (HOSPITAL_COMMUNITY): Payer: BLUE CROSS/BLUE SHIELD | Admitting: Anesthesiology

## 2016-04-25 ENCOUNTER — Inpatient Hospital Stay (HOSPITAL_COMMUNITY): Payer: BLUE CROSS/BLUE SHIELD

## 2016-04-25 DIAGNOSIS — M48061 Spinal stenosis, lumbar region without neurogenic claudication: Secondary | ICD-10-CM | POA: Diagnosis present

## 2016-04-25 DIAGNOSIS — Z8249 Family history of ischemic heart disease and other diseases of the circulatory system: Secondary | ICD-10-CM

## 2016-04-25 DIAGNOSIS — Z87891 Personal history of nicotine dependence: Secondary | ICD-10-CM | POA: Diagnosis not present

## 2016-04-25 DIAGNOSIS — Z888 Allergy status to other drugs, medicaments and biological substances status: Secondary | ICD-10-CM | POA: Diagnosis not present

## 2016-04-25 DIAGNOSIS — M7138 Other bursal cyst, other site: Principal | ICD-10-CM | POA: Diagnosis present

## 2016-04-25 DIAGNOSIS — M5416 Radiculopathy, lumbar region: Secondary | ICD-10-CM | POA: Diagnosis present

## 2016-04-25 DIAGNOSIS — E7800 Pure hypercholesterolemia, unspecified: Secondary | ICD-10-CM | POA: Diagnosis present

## 2016-04-25 DIAGNOSIS — I10 Essential (primary) hypertension: Secondary | ICD-10-CM | POA: Diagnosis present

## 2016-04-25 DIAGNOSIS — Z419 Encounter for procedure for purposes other than remedying health state, unspecified: Secondary | ICD-10-CM

## 2016-04-25 DIAGNOSIS — Z8041 Family history of malignant neoplasm of ovary: Secondary | ICD-10-CM

## 2016-04-25 DIAGNOSIS — M549 Dorsalgia, unspecified: Secondary | ICD-10-CM | POA: Diagnosis present

## 2016-04-25 HISTORY — PX: LUMBAR LAMINECTOMY/DECOMPRESSION MICRODISCECTOMY: SHX5026

## 2016-04-25 LAB — BASIC METABOLIC PANEL
ANION GAP: 11 (ref 5–15)
BUN: 15 mg/dL (ref 6–20)
CHLORIDE: 101 mmol/L (ref 101–111)
CO2: 28 mmol/L (ref 22–32)
CREATININE: 0.91 mg/dL (ref 0.61–1.24)
Calcium: 9.4 mg/dL (ref 8.9–10.3)
Glucose, Bld: 93 mg/dL (ref 65–99)
Potassium: 3.6 mmol/L (ref 3.5–5.1)
SODIUM: 140 mmol/L (ref 135–145)

## 2016-04-25 SURGERY — LUMBAR LAMINECTOMY/DECOMPRESSION MICRODISCECTOMY 1 LEVEL
Anesthesia: General | Site: Back | Laterality: Right

## 2016-04-25 MED ORDER — ACETAMINOPHEN 650 MG RE SUPP: 650.0000 mg | RECTAL | Status: DC | PRN

## 2016-04-25 MED ORDER — MEPERIDINE HCL 25 MG/ML IJ SOLN: 6.2500 mg | INTRAMUSCULAR | Status: DC | PRN

## 2016-04-25 MED ORDER — METOCLOPRAMIDE HCL 5 MG/ML IJ SOLN: 10.0000 mg | Freq: Once | INTRAMUSCULAR | Status: DC | PRN

## 2016-04-25 MED ORDER — THROMBIN 5000 UNITS EX SOLR
CUTANEOUS | Status: DC | PRN
  Administered 2016-04-25 (×2): 5000 [IU] via TOPICAL

## 2016-04-25 MED ORDER — THROMBIN 5000 UNITS EX SOLR
CUTANEOUS | Status: AC
  Filled 2016-04-25: qty 10000

## 2016-04-25 MED ORDER — ONDANSETRON HCL 4 MG/2ML IJ SOLN
INTRAMUSCULAR | Status: AC
  Filled 2016-04-25: qty 2

## 2016-04-25 MED ORDER — CEFAZOLIN SODIUM-DEXTROSE 2-4 GM/100ML-% IV SOLN
2.0000 g | Freq: Three times a day (TID) | INTRAVENOUS | Status: AC
  Administered 2016-04-25 (×2): 2 g via INTRAVENOUS
  Filled 2016-04-25 (×2): qty 100

## 2016-04-25 MED ORDER — MIDAZOLAM HCL 2 MG/2ML IJ SOLN
INTRAMUSCULAR | Status: DC | PRN
  Administered 2016-04-25: 2 mg via INTRAVENOUS

## 2016-04-25 MED ORDER — LACTATED RINGERS IV SOLN
INTRAVENOUS | Status: DC | PRN
  Administered 2016-04-25: 07:00:00 via INTRAVENOUS

## 2016-04-25 MED ORDER — SODIUM CHLORIDE 0.9 % IR SOLN
Status: DC | PRN
  Administered 2016-04-25: 08:00:00

## 2016-04-25 MED ORDER — ALUM & MAG HYDROXIDE-SIMETH 200-200-20 MG/5ML PO SUSP: 30.0000 mL | Freq: Four times a day (QID) | ORAL | Status: DC | PRN

## 2016-04-25 MED ORDER — 0.9 % SODIUM CHLORIDE (POUR BTL) OPTIME
TOPICAL | Status: DC | PRN
  Administered 2016-04-25: 1000 mL

## 2016-04-25 MED ORDER — LACTATED RINGERS IV SOLN: INTRAVENOUS | Status: DC

## 2016-04-25 MED ORDER — CLOTRIMAZOLE 1 % EX CREA: TOPICAL_CREAM | Freq: Two times a day (BID) | CUTANEOUS | Status: DC

## 2016-04-25 MED ORDER — HYDROMORPHONE HCL 1 MG/ML IJ SOLN: 0.5000 mg | INTRAMUSCULAR | Status: DC | PRN

## 2016-04-25 MED ORDER — LOSARTAN POTASSIUM 50 MG PO TABS
100.0000 mg | ORAL_TABLET | Freq: Every day | ORAL | Status: DC
  Administered 2016-04-25 – 2016-04-26 (×2): 100 mg via ORAL
  Filled 2016-04-25 (×2): qty 2

## 2016-04-25 MED ORDER — SUGAMMADEX SODIUM 200 MG/2ML IV SOLN
INTRAVENOUS | Status: AC
  Filled 2016-04-25: qty 2

## 2016-04-25 MED ORDER — ACETAMINOPHEN 325 MG PO TABS: 650.0000 mg | ORAL_TABLET | ORAL | Status: DC | PRN

## 2016-04-25 MED ORDER — GABAPENTIN 300 MG PO CAPS
300.0000 mg | ORAL_CAPSULE | Freq: Every day | ORAL | Status: DC
  Filled 2016-04-25: qty 1

## 2016-04-25 MED ORDER — MENTHOL 3 MG MT LOZG: 1.0000 | LOZENGE | OROMUCOSAL | Status: DC | PRN

## 2016-04-25 MED ORDER — SODIUM CHLORIDE 0.9% FLUSH: 3.0000 mL | INTRAVENOUS | Status: DC | PRN

## 2016-04-25 MED ORDER — LIDOCAINE 2% (20 MG/ML) 5 ML SYRINGE
INTRAMUSCULAR | Status: AC
  Filled 2016-04-25: qty 5

## 2016-04-25 MED ORDER — CEFAZOLIN SODIUM-DEXTROSE 2-4 GM/100ML-% IV SOLN
2.0000 g | INTRAVENOUS | Status: AC
  Administered 2016-04-25: 2 g via INTRAVENOUS
  Filled 2016-04-25: qty 100

## 2016-04-25 MED ORDER — EPHEDRINE SULFATE 50 MG/ML IJ SOLN
INTRAMUSCULAR | Status: DC | PRN
  Administered 2016-04-25 (×2): 10 mg via INTRAVENOUS

## 2016-04-25 MED ORDER — GABAPENTIN 100 MG PO CAPS
200.0000 mg | ORAL_CAPSULE | Freq: Two times a day (BID) | ORAL | Status: DC
  Administered 2016-04-25: 200 mg via ORAL
  Filled 2016-04-25: qty 2

## 2016-04-25 MED ORDER — PHENYLEPHRINE HCL 10 MG/ML IJ SOLN
INTRAMUSCULAR | Status: DC | PRN
  Administered 2016-04-25: 120 ug via INTRAVENOUS
  Administered 2016-04-25: 160 ug via INTRAVENOUS
  Administered 2016-04-25: 120 ug via INTRAVENOUS

## 2016-04-25 MED ORDER — FENTANYL CITRATE (PF) 100 MCG/2ML IJ SOLN
INTRAMUSCULAR | Status: DC | PRN
  Administered 2016-04-25: 100 ug via INTRAVENOUS
  Administered 2016-04-25: 50 ug via INTRAVENOUS

## 2016-04-25 MED ORDER — LOSARTAN POTASSIUM-HCTZ 100-25 MG PO TABS: 1.0000 | ORAL_TABLET | Freq: Every day | ORAL | Status: DC

## 2016-04-25 MED ORDER — LIDOCAINE-EPINEPHRINE (PF) 2 %-1:200000 IJ SOLN
INTRAMUSCULAR | Status: DC | PRN
  Administered 2016-04-25: 10 mL via INTRADERMAL

## 2016-04-25 MED ORDER — LIDOCAINE HCL (CARDIAC) 20 MG/ML IV SOLN
INTRAVENOUS | Status: DC | PRN
  Administered 2016-04-25: 100 mg via INTRAVENOUS

## 2016-04-25 MED ORDER — MIDAZOLAM HCL 2 MG/2ML IJ SOLN
INTRAMUSCULAR | Status: AC
  Filled 2016-04-25: qty 2

## 2016-04-25 MED ORDER — MELOXICAM 7.5 MG PO TABS
15.0000 mg | ORAL_TABLET | Freq: Every day | ORAL | Status: DC
  Administered 2016-04-25 – 2016-04-26 (×2): 15 mg via ORAL
  Filled 2016-04-25 (×2): qty 2

## 2016-04-25 MED ORDER — DEXAMETHASONE SODIUM PHOSPHATE 10 MG/ML IJ SOLN
10.0000 mg | INTRAMUSCULAR | Status: AC
Start: 2016-04-25 — End: 2016-04-25
  Administered 2016-04-25: 10 mg via INTRAVENOUS
  Filled 2016-04-25: qty 1

## 2016-04-25 MED ORDER — ATORVASTATIN CALCIUM 20 MG PO TABS
40.0000 mg | ORAL_TABLET | Freq: Every day | ORAL | Status: DC
Start: 2016-04-26 — End: 2016-04-26
  Administered 2016-04-26: 40 mg via ORAL
  Filled 2016-04-25: qty 2

## 2016-04-25 MED ORDER — PANTOPRAZOLE SODIUM 40 MG IV SOLR
40.0000 mg | Freq: Every day | INTRAVENOUS | Status: DC
  Administered 2016-04-25: 40 mg via INTRAVENOUS
  Filled 2016-04-25: qty 40

## 2016-04-25 MED ORDER — SUGAMMADEX SODIUM 200 MG/2ML IV SOLN
INTRAVENOUS | Status: DC | PRN
  Administered 2016-04-25: 200 mg via INTRAVENOUS

## 2016-04-25 MED ORDER — ONDANSETRON HCL 4 MG/2ML IJ SOLN
INTRAMUSCULAR | Status: DC | PRN
  Administered 2016-04-25: 4 mg via INTRAVENOUS

## 2016-04-25 MED ORDER — PROPOFOL 10 MG/ML IV BOLUS
INTRAVENOUS | Status: AC
  Filled 2016-04-25: qty 20

## 2016-04-25 MED ORDER — PHENOL 1.4 % MT LIQD: 1.0000 | OROMUCOSAL | Status: DC | PRN

## 2016-04-25 MED ORDER — BUPIVACAINE HCL (PF) 0.25 % IJ SOLN
INTRAMUSCULAR | Status: AC
  Filled 2016-04-25: qty 30

## 2016-04-25 MED ORDER — FENTANYL CITRATE (PF) 100 MCG/2ML IJ SOLN
25.0000 ug | INTRAMUSCULAR | Status: DC | PRN
  Administered 2016-04-25 (×2): 50 ug via INTRAVENOUS

## 2016-04-25 MED ORDER — CYCLOBENZAPRINE HCL 10 MG PO TABS: 10.0000 mg | ORAL_TABLET | Freq: Three times a day (TID) | ORAL | Status: DC | PRN

## 2016-04-25 MED ORDER — HEMOSTATIC AGENTS (NO CHARGE) OPTIME
TOPICAL | Status: DC | PRN
  Administered 2016-04-25: 1 via TOPICAL

## 2016-04-25 MED ORDER — SODIUM CHLORIDE 0.9% FLUSH
3.0000 mL | Freq: Two times a day (BID) | INTRAVENOUS | Status: DC
  Administered 2016-04-25 (×2): 3 mL via INTRAVENOUS

## 2016-04-25 MED ORDER — ONDANSETRON HCL 4 MG/2ML IJ SOLN: 4.0000 mg | INTRAMUSCULAR | Status: DC | PRN

## 2016-04-25 MED ORDER — CYCLOBENZAPRINE HCL 10 MG PO TABS: 10.0000 mg | ORAL_TABLET | Freq: Two times a day (BID) | ORAL | Status: DC | PRN

## 2016-04-25 MED ORDER — ROCURONIUM BROMIDE 100 MG/10ML IV SOLN
INTRAVENOUS | Status: DC | PRN
  Administered 2016-04-25: 50 mg via INTRAVENOUS

## 2016-04-25 MED ORDER — LIDOCAINE-EPINEPHRINE (PF) 2 %-1:200000 IJ SOLN
INTRAMUSCULAR | Status: AC
  Filled 2016-04-25: qty 20

## 2016-04-25 MED ORDER — BUPIVACAINE HCL (PF) 0.25 % IJ SOLN
INTRAMUSCULAR | Status: DC | PRN
  Administered 2016-04-25: 10 mL

## 2016-04-25 MED ORDER — PANTOPRAZOLE SODIUM 40 MG PO TBEC: 40.0000 mg | DELAYED_RELEASE_TABLET | Freq: Every day | ORAL | Status: DC

## 2016-04-25 MED ORDER — FENTANYL CITRATE (PF) 100 MCG/2ML IJ SOLN
INTRAMUSCULAR | Status: AC
Start: 2016-04-25 — End: 2016-04-25
  Filled 2016-04-25: qty 2

## 2016-04-25 MED ORDER — PROPOFOL 10 MG/ML IV BOLUS
INTRAVENOUS | Status: DC | PRN
  Administered 2016-04-25: 150 mg via INTRAVENOUS

## 2016-04-25 MED ORDER — PHENYLEPHRINE HCL 10 MG/ML IJ SOLN
INTRAVENOUS | Status: DC | PRN
  Administered 2016-04-25: 50 ug/min via INTRAVENOUS

## 2016-04-25 MED ORDER — OXYCODONE-ACETAMINOPHEN 5-325 MG PO TABS
1.0000 | ORAL_TABLET | ORAL | Status: DC | PRN
  Administered 2016-04-25 – 2016-04-26 (×4): 2 via ORAL
  Filled 2016-04-25 (×4): qty 2

## 2016-04-25 MED ORDER — HYDROCHLOROTHIAZIDE 25 MG PO TABS
25.0000 mg | ORAL_TABLET | Freq: Every day | ORAL | Status: DC
  Administered 2016-04-25 – 2016-04-26 (×2): 25 mg via ORAL
  Filled 2016-04-25 (×2): qty 1

## 2016-04-25 MED ORDER — FENTANYL CITRATE (PF) 100 MCG/2ML IJ SOLN
INTRAMUSCULAR | Status: AC
  Filled 2016-04-25: qty 4

## 2016-04-25 MED ORDER — ROCURONIUM BROMIDE 50 MG/5ML IV SOSY
PREFILLED_SYRINGE | INTRAVENOUS | Status: AC
  Filled 2016-04-25: qty 5

## 2016-04-25 SURGICAL SUPPLY — 51 items
BAG DECANTER FOR FLEXI CONT (MISCELLANEOUS) ×2 IMPLANT
BENZOIN TINCTURE PRP APPL 2/3 (GAUZE/BANDAGES/DRESSINGS) ×2 IMPLANT
BLADE CLIPPER SURG (BLADE) ×2 IMPLANT
BLADE SURG 11 STRL SS (BLADE) ×2 IMPLANT
BUR CUTTER 7.0 ROUND (BURR) ×2 IMPLANT
BUR MATCHSTICK NEURO 3.0 LAGG (BURR) ×2 IMPLANT
BUR PRECISION FLUTE 6.0 (BURR) IMPLANT
CANISTER SUCT 3000ML PPV (MISCELLANEOUS) ×2 IMPLANT
CARTRIDGE OIL MAESTRO DRILL (MISCELLANEOUS) ×1 IMPLANT
DECANTER SPIKE VIAL GLASS SM (MISCELLANEOUS) ×2 IMPLANT
DERMABOND ADVANCED (GAUZE/BANDAGES/DRESSINGS) ×1
DERMABOND ADVANCED .7 DNX12 (GAUZE/BANDAGES/DRESSINGS) ×1 IMPLANT
DIFFUSER DRILL AIR PNEUMATIC (MISCELLANEOUS) ×2 IMPLANT
DRAPE HALF SHEET 40X57 (DRAPES) IMPLANT
DRAPE LAPAROTOMY 100X72X124 (DRAPES) ×2 IMPLANT
DRAPE MICROSCOPE LEICA (MISCELLANEOUS) ×2 IMPLANT
DRAPE POUCH INSTRU U-SHP 10X18 (DRAPES) ×2 IMPLANT
DRAPE SURG 17X23 STRL (DRAPES) ×2 IMPLANT
DRSG OPSITE POSTOP 4X6 (GAUZE/BANDAGES/DRESSINGS) ×2 IMPLANT
DURAPREP 26ML APPLICATOR (WOUND CARE) ×2 IMPLANT
ELECT REM PT RETURN 9FT ADLT (ELECTROSURGICAL) ×2
ELECTRODE REM PT RTRN 9FT ADLT (ELECTROSURGICAL) ×1 IMPLANT
GAUZE SPONGE 4X4 12PLY STRL (GAUZE/BANDAGES/DRESSINGS) ×2 IMPLANT
GAUZE SPONGE 4X4 16PLY XRAY LF (GAUZE/BANDAGES/DRESSINGS) IMPLANT
GLOVE BIO SURGEON STRL SZ8 (GLOVE) ×2 IMPLANT
GLOVE EXAM NITRILE LRG STRL (GLOVE) IMPLANT
GLOVE EXAM NITRILE XL STR (GLOVE) IMPLANT
GLOVE EXAM NITRILE XS STR PU (GLOVE) IMPLANT
GLOVE INDICATOR 8.5 STRL (GLOVE) ×2 IMPLANT
GOWN STRL REUS W/ TWL LRG LVL3 (GOWN DISPOSABLE) ×1 IMPLANT
GOWN STRL REUS W/ TWL XL LVL3 (GOWN DISPOSABLE) ×2 IMPLANT
GOWN STRL REUS W/TWL 2XL LVL3 (GOWN DISPOSABLE) IMPLANT
GOWN STRL REUS W/TWL LRG LVL3 (GOWN DISPOSABLE) ×1
GOWN STRL REUS W/TWL XL LVL3 (GOWN DISPOSABLE) ×2
KIT BASIN OR (CUSTOM PROCEDURE TRAY) ×2 IMPLANT
KIT ROOM TURNOVER OR (KITS) ×2 IMPLANT
NEEDLE HYPO 22GX1.5 SAFETY (NEEDLE) ×2 IMPLANT
NEEDLE SPNL 22GX3.5 QUINCKE BK (NEEDLE) ×2 IMPLANT
NS IRRIG 1000ML POUR BTL (IV SOLUTION) ×2 IMPLANT
OIL CARTRIDGE MAESTRO DRILL (MISCELLANEOUS) ×2
PACK LAMINECTOMY NEURO (CUSTOM PROCEDURE TRAY) ×2 IMPLANT
RUBBERBAND STERILE (MISCELLANEOUS) ×4 IMPLANT
SPONGE SURGIFOAM ABS GEL SZ50 (HEMOSTASIS) ×4 IMPLANT
STRIP CLOSURE SKIN 1/2X4 (GAUZE/BANDAGES/DRESSINGS) ×2 IMPLANT
SUT VIC AB 0 CT1 18XCR BRD8 (SUTURE) ×1 IMPLANT
SUT VIC AB 0 CT1 8-18 (SUTURE) ×1
SUT VIC AB 2-0 CT1 18 (SUTURE) ×2 IMPLANT
SUT VICRYL 4-0 PS2 18IN ABS (SUTURE) ×2 IMPLANT
TOWEL OR 17X24 6PK STRL BLUE (TOWEL DISPOSABLE) ×2 IMPLANT
TOWEL OR 17X26 10 PK STRL BLUE (TOWEL DISPOSABLE) ×2 IMPLANT
WATER STERILE IRR 1000ML POUR (IV SOLUTION) ×2 IMPLANT

## 2016-04-25 NOTE — Anesthesia Procedure Notes (Signed)
Procedure Name: Intubation Date/Time: 04/25/2016 7:30 AM Performed by: Sampson Si E Pre-anesthesia Checklist: Patient identified, Emergency Drugs available, Suction available and Patient being monitored Patient Re-evaluated:Patient Re-evaluated prior to inductionOxygen Delivery Method: Circle System Utilized Preoxygenation: Pre-oxygenation with 100% oxygen Intubation Type: IV induction Ventilation: Mask ventilation without difficulty Laryngoscope Size: Mac and 3 Grade View: Grade III Tube type: Oral Tube size: 7.5 mm Number of attempts: 1 Airway Equipment and Method: Stylet and Oral airway Placement Confirmation: ETT inserted through vocal cords under direct vision,  positive ETCO2 and breath sounds checked- equal and bilateral Secured at: 22 cm Tube secured with: Tape Dental Injury: Teeth and Oropharynx as per pre-operative assessment

## 2016-04-25 NOTE — Transfer of Care (Signed)
Immediate Anesthesia Transfer of Care Note  Patient: [name]  Procedure(s) Performed: Procedure(s): Laminectomy for facet/synovial cyst - Lumbar four- Lumbar five - right (Right)  Patient Location: PACU  Anesthesia Type:General  Level of Consciousness: awake and patient cooperative  Airway & Oxygen Therapy: Patient Spontanous Breathing and Patient connected to nasal cannula oxygen  Post-op Assessment: Report given to RN and Patient moving all extremities X 4  Post vital signs: Reviewed and stable  Last Vitals:  Vitals:   04/25/16 0646  BP: (!) 163/110  Pulse: 87  Resp: 20  Temp: 36.6 C    Last Pain:  Vitals:   04/25/16 0646  TempSrc: Oral      Patients Stated Pain Goal: 2 (0000000 [id number])  Complications: No apparent anesthesia complications

## 2016-04-25 NOTE — Anesthesia Preprocedure Evaluation (Addendum)
Anesthesia Evaluation  Patient identified by MRN, date of birth, ID band Patient awake    Reviewed: Allergy & Precautions, NPO status , Patient's Chart, lab work & pertinent test results  Airway Mallampati: II  TM Distance: >3 FB Neck ROM: Full    Dental  (+) Teeth Intact, Dental Advisory Given   Pulmonary former smoker,    Pulmonary exam normal        Cardiovascular hypertension, Pt. on medications  Rhythm:Regular Rate:Normal     Neuro/Psych negative neurological ROS  negative psych ROS   GI/Hepatic negative GI ROS, Neg liver ROS,   Endo/Other  negative endocrine ROS  Renal/GU negative Renal ROS  negative genitourinary   Musculoskeletal negative musculoskeletal ROS (+)   Abdominal   Peds negative pediatric ROS (+)  Hematology negative hematology ROS (+)   Anesthesia Other Findings   Reproductive/Obstetrics negative OB ROS                          Anesthesia Physical Anesthesia Plan  ASA: II  Anesthesia Plan: General   Post-op Pain Management:    Induction: Intravenous  Airway Management Planned: Oral ETT  Additional Equipment:   Intra-op Plan:   Post-operative Plan: Extubation in OR  Informed Consent: I have reviewed the patients History and Physical, chart, labs and discussed the procedure including the risks, benefits and alternatives for the proposed anesthesia with the patient or authorized representative who has indicated his/her understanding and acceptance.   Dental advisory given  Plan Discussed with: CRNA  Anesthesia Plan Comments:         Anesthesia Quick Evaluation

## 2016-04-25 NOTE — Op Note (Signed)
Preoperative diagnosis: Right L5 radiculopathy from synovial cyst L4-5 right  Postoperative diagnosis: Same  Procedure: Lumbar laminectomy for resection of synovial cyst with microscopic dissection of the right L5 nerve root microscopic resection of the synovial cyst or graft surgeon: Dominica Severin Egbert Seidel  Asst.: Sherley Bounds  Anesthesia: Gen.  EBL: Minimal  History of present illness: Patient is a very pleasant 16 years and was a progress worsening back and right leg pain workup revealed a large synovial cyst on the right at L4-5 causing severe thecal sac compression right L5 nerve root compression. Due to patient's failure conservative treatment imaging findings and progression of clinical syndrome I recommended laminectomy for resection of synovial cyst. I extensively went over the risks and benefits of the operation with him as well as perioperative course expectations of outcome and alternatives of surgery and he understood and agreed to proceed forward.   Operative procedure: Patient brought into the or was induced under general anesthesia positioned prone the Wilson frame his back was prepped and draped in routine sterile fashion preoperative x-ray localize the appropriate level so after infiltration of 10 mL lidocaine with epi a midline incision was made and Bovie left car was used to calcification subperiosteal dissections care lamina of L4 and L5 confirmed by interoperative x-ray. There was a large dystrophic calcification on top of the L4-5 facet that was going medially towards the lamina so after I dissected out the laminae drilled down this dystrophic calcification in the inferior aspect of the 4 lamina and the super aspect of the 5 lamina. Laminotomy was begun with a 2 and 3 Kerrison punch. Ligament was a markedly hypertrophied. I dissected this away identified the medial and superior aspect of thecal sac and then under microscopic illumination further under bit more ligament laterally to identify  the lateral margin canal the lateral thecal sac. Then a very large synovial cyst was immediately identified and this was teased off of the dura with a 4 Penfield and after a worked around it and removing its attachment medially getting inferior to it and identified him for margin as able to resect the cyst on en bloc. Then further under bit the medial gutter to remove all residual of the cyst membrane thecal sac was now widely decompressed the L5 foramen was widely open there is no further stenosis. I encompassing irrigated wound layer Gelfoam overtop the dura closed the wound in layers with interrupted Vicryl and a running 4 subcuticular Dermabond benzo and Steri-Strips and sterile dressing was applied and patient recovered in stable condition. At the end of case all needle counts and sponge counts were correct.

## 2016-04-25 NOTE — H&P (Signed)
[name] is an 61 y.o. male.   Chief Complaint: Back and right leg pain HPI: Patient is a 61 year old them is a progress worsening back and right leg pain rating down L5 nerve root pattern. Workup revealed a large synovial cyst causing severe stenosis on thecal sac and right L5 nerve root. Coming off the right-sided L4-5 facet joint. Because of the patient's failure conservative treatment imaging findings and progression of clinical syndrome I recommended laminectomy for resection of right-sided synovial cyst at L4-5. I've extensively gone over the risks and benefits of the operation with the patient as well as perioperative course expectations of outcome and alternatives surgery and he understood and agreed to proceed forward.  Past Medical History:  Diagnosis Date  . COUGH DUE TO ACE INHIBITORS 09/25/2008  . DEPRESSION 11/12/2006  . ELECTROCARDIOGRAM, ABNORMAL 03/10/2008  . HYPERCHOLESTEROLEMIA 03/10/2008  . HYPERTENSION 11/12/2006   Echo (12/09) with EF 65%, no valvular abnormalities  . LEUKOPENIA, CHRONIC 03/10/2008    Past Surgical History:  Procedure Laterality Date  . COLONOSCOPY    . stress cardiolite  10/04/2001    Family History  Problem Relation Age of Onset  . Cancer Mother     Ovarian Cancer  . Ovarian cancer Mother   . Hypertension Father   . Alcohol abuse Maternal Aunt   . Alcohol abuse Maternal Uncle   . Alcohol abuse Cousin   . Heart disease Neg Hx   . Colon cancer Neg Hx   . Colon polyps Neg Hx   . Esophageal cancer Neg Hx   . Rectal cancer Neg Hx   . Stomach cancer Neg Hx    Social History:  reports that he quit smoking about 11 years ago. He has never used smokeless tobacco. He reports that he does not drink alcohol or use drugs.  Allergies:  Allergies  Allergen Reactions  . Lisinopril Cough    Medications Prior to Admission  Medication Sig Dispense Refill  . atorvastatin (LIPITOR) 40 MG tablet TAKE 1 TABLET (40 MG TOTAL) BY MOUTH DAILY. (Patient  taking differently: Take 40 mg by mouth daily. ) 30 tablet 11  . cyclobenzaprine (FLEXERIL) 10 MG tablet Take 10 mg by mouth 2 (two) times daily as needed for muscle spasms.    Marland Kitchen losartan-hydrochlorothiazide (HYZAAR) 100-25 MG tablet Take 1 tablet by mouth daily. 30 tablet 11  . clotrimazole-betamethasone (LOTRISONE) cream Apply 1 application topically 3 (three) times daily as needed. For rash (Patient not taking: Reported on 04/16/2016) 45 g 3  . Diclofenac Sodium (PENNSAID) 2 % SOLN Place 2 application onto the skin 2 (two) times daily. (Patient taking differently: Place 2 application onto the skin daily as needed. ) 112 g 3  . gabapentin (NEURONTIN) 100 MG capsule TAKE 2 CAPSULES BY MOUTH TWICE A DAY (Patient not taking: Reported on 04/16/2016) 60 capsule 5  . gabapentin (NEURONTIN) 300 MG capsule Take 1 capsule (300 mg total) by mouth at bedtime. (Patient not taking: Reported on 04/16/2016) 90 capsule 1  . meloxicam (MOBIC) 15 MG tablet Take 1 tablet (15 mg total) by mouth daily. (Patient not taking: Reported on 04/16/2016) 30 tablet 0  . omeprazole (PRILOSEC) 20 MG capsule TAKE ONE CAPSULE BY MOUTH EVERY DAY (Patient not taking: Reported on 04/16/2016) 30 capsule 3    No results found for this or any previous visit (from the past 48 hour(s)). No results found.  Review of Systems  Musculoskeletal: Positive for back pain, joint pain and myalgias.  Neurological: Positive  for tingling.    Blood pressure (!) 163/110, pulse 87, temperature 97.8 F (36.6 C), temperature source Oral, resp. rate 20, height 5\' 6"  (1.676 m), weight 88.1 kg (194 lb 4.8 oz), SpO2 96 %. Physical Exam  Constitutional: He is oriented to person, place, and time. He appears well-developed and well-nourished.  HENT:  Head: Normocephalic.  Eyes: Pupils are equal, round, and reactive to light.  Neck: Normal range of motion.  Respiratory: Effort normal and breath sounds normal.  GI: Soft. Bowel sounds are normal.   Neurological: He is alert and oriented to person, place, and time. He has normal strength. GCS eye subscore is 4. GCS verbal subscore is 5. GCS motor subscore is 6.  Strength is 5 out of 5 iliopsoas, quads, hip she's, gastric, and tibialis, and EHL.  Skin: Skin is warm and dry.     Assessment/Plan 45 year gentleman presents for right-sided L4-5 laminectomy for resection of synovial cyst  Cherlyn Syring P, MD 04/25/2016, 7:13 AM

## 2016-04-25 NOTE — Anesthesia Postprocedure Evaluation (Signed)
Anesthesia Post Note  Patient: [name]  Procedure(s) Performed: Procedure(s) (LRB): Laminectomy for facet/synovial cyst - Lumbar four- Lumbar five - right (Right)  Patient location during evaluation: PACU Anesthesia Type: General Level of consciousness: awake and alert Pain management: pain level controlled Vital Signs Assessment: post-procedure vital signs reviewed and stable Respiratory status: spontaneous breathing, nonlabored ventilation, respiratory function stable and patient connected to nasal cannula oxygen Cardiovascular status: blood pressure returned to baseline and stable Postop Assessment: no signs of nausea or vomiting Anesthetic complications: no       Last Vitals:  Vitals:   04/25/16 1011 04/25/16 1021  BP: (!) 149/115   Pulse: 96 98  Resp: (!) 21 (!) 28  Temp:      Last Pain:  Vitals:   04/25/16 1021  TempSrc:   PainSc: 8                  Montez Hageman

## 2016-04-26 ENCOUNTER — Encounter (HOSPITAL_COMMUNITY): Payer: Self-pay | Admitting: Neurosurgery

## 2016-04-26 MED ORDER — OXYCODONE-ACETAMINOPHEN 5-325 MG PO TABS: 1.0000 | ORAL_TABLET | ORAL | 0 refills | Status: DC | PRN

## 2016-04-26 NOTE — Discharge Instructions (Signed)

## 2016-04-26 NOTE — Discharge Summary (Signed)
  Physician Discharge Summary  Patient ID: [name] MRN: [id number] DOB/AGE: June 14, 1955 61 y.o.  Admit date: 04/25/2016 Discharge date: 04/26/2016  Admission Diagnoses:L4-5 synovial cyst right  Discharge Diagnoses: Same Active Problems:   Synovial cyst of lumbar facet joint   Discharged Condition: good  Hospital Course: Patient is admitted hospital underwent decompressive laminectomy for resection of synovial cyst and postoperative patient did very well recovered in the floor on the floor was angling and voiding spontaneously tolerating regular diet was stable for discharge home. Patient will be discharged her scheduled follow-up in one to 2 weeks.  Consults: Significant Diagnostic Studies: Treatments: L4-5 laminectomy for resection of synovial cyst Discharge Exam: Blood pressure 116/76, pulse 95, temperature 98.3 F (36.8 C), temperature source Oral, resp. rate 18, height 5\' 6"  (1.676 m), weight 88.1 kg (194 lb 4.8 oz), SpO2 95 %. Strength 5 out of 5 wound clean dry and intact  Disposition: Home   Allergies as of 04/26/2016      Reactions   Lisinopril Cough      Medication List    TAKE these medications   atorvastatin 40 MG tablet Commonly known as:  LIPITOR TAKE 1 TABLET (40 MG TOTAL) BY MOUTH DAILY. What changed:  how much to take  how to take this  when to take this  additional instructions   clotrimazole-betamethasone cream Commonly known as:  LOTRISONE Apply 1 application topically 3 (three) times daily as needed. For rash   cyclobenzaprine 10 MG tablet Commonly known as:  FLEXERIL Take 10 mg by mouth 2 (two) times daily as needed for muscle spasms.   Diclofenac Sodium 2 % Soln Commonly known as:  PENNSAID Place 2 application onto the skin 2 (two) times daily. What changed:  when to take this  reasons to take this   gabapentin 300 MG capsule Commonly known as:  NEURONTIN Take 1 capsule (300 mg total) by mouth at bedtime.   gabapentin  100 MG capsule Commonly known as:  NEURONTIN TAKE 2 CAPSULES BY MOUTH TWICE A DAY   losartan-hydrochlorothiazide 100-25 MG tablet Commonly known as:  HYZAAR Take 1 tablet by mouth daily.   meloxicam 15 MG tablet Commonly known as:  MOBIC Take 1 tablet (15 mg total) by mouth daily.   omeprazole 20 MG capsule Commonly known as:  PRILOSEC TAKE ONE CAPSULE BY MOUTH EVERY DAY   oxyCODONE-acetaminophen 5-325 MG tablet Commonly known as:  PERCOCET/ROXICET Take 1-2 tablets by mouth every 4 (four) hours as needed for moderate pain.      Follow-up Information    Sherese Heyward P, MD Follow up in 2 day(s).   Specialty:  Neurosurgery Contact information: [address] [place] [address] [phone]        Elaina Hoops, MD .   Specialty:  Neurosurgery Contact information: [address] [place] [address] [phone]           Signed: Elaina Hoops 04/26/2016, 7:24 AM

## 2016-04-26 NOTE — Progress Notes (Signed)
Patient alert and oriented, mae's well, voiding adequate amount of urine, swallowing without difficulty, no c/o pain. Patient discharged home with family. Script and discharged instructions given to patient. Patient and family stated understanding of d/c instructions given and has an appointment with MD. 

## 2016-05-08 ENCOUNTER — Other Ambulatory Visit: Payer: Self-pay | Admitting: Family Medicine

## 2016-05-08 NOTE — Telephone Encounter (Signed)
Refill done.  

## 2016-06-06 ENCOUNTER — Encounter: Payer: Self-pay | Admitting: Endocrinology

## 2016-06-06 ENCOUNTER — Ambulatory Visit (INDEPENDENT_AMBULATORY_CARE_PROVIDER_SITE_OTHER): Payer: BLUE CROSS/BLUE SHIELD | Admitting: Endocrinology

## 2016-06-06 VITALS — BP 136/80 | HR 78 | Ht 66.0 in | Wt 197.0 lb

## 2016-06-06 DIAGNOSIS — Z0000 Encounter for general adult medical examination without abnormal findings: Secondary | ICD-10-CM

## 2016-06-06 NOTE — Progress Notes (Signed)
   Subjective:    Patient ID: [name], male    DOB: [birth year], 61 y.o.   MRN: [id number]  HPI (pt cancels visit, as cpx is not yet due)   Review of Systems     Objective:   Physical Exam        Assessment & Plan:

## 2016-06-06 NOTE — Patient Instructions (Signed)
Please come back for a regular physical appointment next week

## 2016-06-09 ENCOUNTER — Other Ambulatory Visit: Payer: Self-pay | Admitting: Endocrinology

## 2016-07-01 ENCOUNTER — Encounter: Payer: BLUE CROSS/BLUE SHIELD | Admitting: Endocrinology

## 2016-07-22 ENCOUNTER — Ambulatory Visit (INDEPENDENT_AMBULATORY_CARE_PROVIDER_SITE_OTHER): Payer: BLUE CROSS/BLUE SHIELD | Admitting: Endocrinology

## 2016-07-22 VITALS — BP 130/80 | HR 89 | Ht 66.0 in | Wt 195.0 lb

## 2016-07-22 DIAGNOSIS — R972 Elevated prostate specific antigen [PSA]: Secondary | ICD-10-CM | POA: Diagnosis not present

## 2016-07-22 DIAGNOSIS — E119 Type 2 diabetes mellitus without complications: Secondary | ICD-10-CM

## 2016-07-22 DIAGNOSIS — Z0000 Encounter for general adult medical examination without abnormal findings: Secondary | ICD-10-CM | POA: Diagnosis not present

## 2016-07-22 LAB — HEPATIC FUNCTION PANEL
ALBUMIN: 4.2 g/dL (ref 3.5–5.2)
ALT: 14 U/L (ref 0–53)
AST: 18 U/L (ref 0–37)
Alkaline Phosphatase: 74 U/L (ref 39–117)
Bilirubin, Direct: 0.1 mg/dL (ref 0.0–0.3)
Total Bilirubin: 0.6 mg/dL (ref 0.2–1.2)
Total Protein: 7.2 g/dL (ref 6.0–8.3)

## 2016-07-22 LAB — BASIC METABOLIC PANEL
BUN: 18 mg/dL (ref 6–23)
CO2: 31 meq/L (ref 19–32)
Calcium: 9.5 mg/dL (ref 8.4–10.5)
Chloride: 101 mEq/L (ref 96–112)
Creatinine, Ser: 1.15 mg/dL (ref 0.40–1.50)
GFR: 83.43 mL/min (ref >60.00)
Glucose, Bld: 127 mg/dL — ABNORMAL HIGH (ref 70–99)
POTASSIUM: 3.8 meq/L (ref 3.5–5.1)
SODIUM: 141 meq/L (ref 135–145)

## 2016-07-22 LAB — CBC WITH DIFFERENTIAL/PLATELET
BASOS PCT: 0.5 % (ref 0.0–3.0)
Basophils Absolute: 0 10*3/uL (ref 0.0–0.1)
EOS PCT: 2.2 % (ref 0.0–5.0)
Eosinophils Absolute: 0.1 10*3/uL (ref 0.0–0.7)
HCT: 39.6 % (ref 39.0–52.0)
Hemoglobin: 12.6 g/dL — ABNORMAL LOW (ref 13.0–17.0)
LYMPHS ABS: 1.7 10*3/uL (ref 0.7–4.0)
Lymphocytes Relative: 44.4 % (ref 12.0–46.0)
MCHC: 31.8 g/dL (ref 30.0–36.0)
MCV: 72.4 fl — AB (ref 78.0–100.0)
MONOS PCT: 9.4 % (ref 3.0–12.0)
Monocytes Absolute: 0.4 10*3/uL (ref 0.1–1.0)
NEUTROS ABS: 1.7 10*3/uL (ref 1.4–7.7)
Neutrophils Relative %: 43.5 % (ref 43.0–77.0)
Platelets: 364 10*3/uL (ref 150.0–400.0)
RBC: 5.47 Mil/uL (ref 4.22–5.81)
RDW: 15.4 % (ref 11.5–15.5)
WBC: 3.9 10*3/uL — ABNORMAL LOW (ref 4.0–10.5)

## 2016-07-22 LAB — URINALYSIS, ROUTINE W REFLEX MICROSCOPIC
BILIRUBIN URINE: NEGATIVE
HGB URINE DIPSTICK: NEGATIVE
KETONES UR: NEGATIVE
LEUKOCYTES UA: NEGATIVE
Nitrite: NEGATIVE
RBC / HPF: NONE SEEN (ref 0–?)
Specific Gravity, Urine: 1.015 (ref 1.000–1.030)
Total Protein, Urine: NEGATIVE
UROBILINOGEN UA: 1 (ref 0.0–1.0)
Urine Glucose: NEGATIVE
WBC UA: NONE SEEN (ref 0–?)
pH: 7.5 (ref 5.0–8.0)

## 2016-07-22 LAB — HEMOGLOBIN A1C: HEMOGLOBIN A1C: 6.7 % — AB (ref 4.6–6.5)

## 2016-07-22 LAB — LIPID PANEL
CHOLESTEROL: 152 mg/dL (ref 0–200)
HDL: 42.9 mg/dL (ref >39.00)
LDL Cholesterol: 84 mg/dL (ref 0–99)
NonHDL: 109.46
TRIGLYCERIDES: 127 mg/dL (ref 0.0–149.0)
Total CHOL/HDL Ratio: 4
VLDL: 25.4 mg/dL (ref 0.0–40.0)

## 2016-07-22 LAB — TSH: TSH: 0.77 u[IU]/mL (ref 0.35–4.50)

## 2016-07-22 LAB — PSA: PSA: 8.1 ng/mL — AB (ref 0.10–4.00)

## 2016-07-22 NOTE — Patient Instructions (Signed)
blood tests are requested for you today.  We'll let you know about the results. Please consider these measures for your health:  minimize alcohol.  Do not use tobacco products.  Have a colonoscopy at least every 10 years from age 61.  Keep firearms safely stored.  Always use seat belts.  have working smoke alarms in your home.  See an eye doctor and dentist regularly.  Never drive under the influence of alcohol or drugs (including prescription drugs).   Please return in 1 year.

## 2016-07-22 NOTE — Progress Notes (Signed)
Subjective:    Patient ID: [name], male    DOB: [birth year], 61 y.o.   MRN: [id number]  HPI Pt is here for regular wellness examination, and is feeling pretty well in general, and says chronic med probs are stable, except as noted below Past Medical History:  Diagnosis Date  . COUGH DUE TO ACE INHIBITORS 09/25/2008  . DEPRESSION 11/12/2006  . ELECTROCARDIOGRAM, ABNORMAL 03/10/2008  . HYPERCHOLESTEROLEMIA 03/10/2008  . HYPERTENSION 11/12/2006   Echo (12/09) with EF 65%, no valvular abnormalities  . LEUKOPENIA, CHRONIC 03/10/2008    Past Surgical History:  Procedure Laterality Date  . COLONOSCOPY    . LUMBAR LAMINECTOMY/DECOMPRESSION MICRODISCECTOMY Right 04/25/2016   Procedure: Laminectomy for facet/synovial cyst - Lumbar four- Lumbar five - right;  Surgeon: Kary Kos, MD;  Location: [facility];  Service: Neurosurgery;  Laterality: Right;  . stress cardiolite  10/04/2001    Social History   Social History  . Marital status: Married    Spouse name: N/A  . Number of children: N/A  . Years of education: N/A   Occupational History  . [employer]    Social History Main Topics  . Smoking status: Former Smoker    Quit date: 04/07/2005  . Smokeless tobacco: Never Used  . Alcohol use No  . Drug use: No     Comment: pt denies using any recreational or street drugs (04/18/2016)  . Sexual activity: Yes   Other Topics Concern  . 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 atorvastatin (LIPITOR) 40 MG tablet TAKE 1 TABLET (40 MG TOTAL) BY MOUTH DAILY. 30 tablet 8  . cyclobenzaprine (FLEXERIL) 10 MG tablet Take 10 mg by mouth 2 (two) times daily as needed for muscle spasms.    . Diclofenac Sodium (PENNSAID) 2 % SOLN Place 2 application onto the skin 2 (two) times daily. (Patient taking  differently: Place 2 application onto the skin daily as needed. ) 112 g 3  . losartan-hydrochlorothiazide (HYZAAR) 100-25 MG tablet Take 1 tablet by mouth daily. 30 tablet 11  . meloxicam (MOBIC) 15 MG tablet TAKE 1 TABLET BY MOUTH EVERY DAY 30 tablet 0   No current facility-administered medications on file prior to visit.     Allergies  Allergen Reactions  . Lisinopril Cough    Family History  Problem Relation Age of Onset  . Cancer Mother     Ovarian Cancer  . Ovarian cancer Mother   . Hypertension Father   . Alcohol abuse Maternal Aunt   . Alcohol abuse Maternal Uncle   . Alcohol abuse Cousin   . Heart disease Neg Hx   . Colon cancer Neg Hx   . Colon polyps Neg Hx   . Esophageal cancer Neg Hx   . Rectal cancer Neg Hx   . Stomach cancer Neg Hx     BP 130/80   Pulse 89   Ht 5\' 6"  (1.676 m)   Wt 195 lb (88.5 kg)   SpO2 98%   BMI 31.47 kg/m    Review of Systems Review of Systems  Constitutional: Negative for fever.  HENT: Negative for hearing loss.   Eyes: Negative for visual disturbance.  Respiratory: Negative for shortness of breath.   Cardiovascular: Negative for  chest pain.  Gastrointestinal: Negative for anal bleeding.  Endocrine: Negative for cold intolerance.  Genitourinary: Negative for hematuria and difficulty urinating.  Musculoskeletal: Negative for gait problem.  Skin: Negative for rash.  Allergic/Immunologic: Negative for environmental allergies.  Neurological: Negative for numbness.  Hematological: Does not bruise/bleed easily.  Psychiatric/Behavioral: Negative for dysphoric mood.     Objective:   Physical Exam VS: see vs page GEN: no distress HEAD: head: no deformity eyes: no periorbital swelling, no proptosis external nose and ears are normal mouth: no lesion seen NECK: supple, thyroid is not enlarged CHEST WALL: no deformity LUNGS: clear to auscultation BREASTS:  No gynecomastia CV: reg rate and rhythm, no murmur ABD: abdomen is  soft, nontender.  no hepatosplenomegaly.  not distended.  no hernia RECTAL: normal external and internal exam.  heme neg. PROSTATE:  Enlarged, but no palpable nodule.   MUSCULOSKELETAL: muscle bulk and strength are grossly normal.  no obvious joint swelling.  gait is normal and steady EXTEMITIES: no deformity.  no ulcer on the feet.  feet are of normal color and temp.  no edema PULSES: dorsalis pedis intact bilat.  no carotid bruit NEURO:  cn 2-12 grossly intact.   readily moves all 4's.  sensation is intact to touch on the feet SKIN:  Normal texture and temperature.     NODES:  None palpable at the neck.  PSYCH: alert, well-oriented.  Does not appear anxious nor depressed.       Assessment & Plan:  Wellness visit today, with problems stable, except as noted. Patient Instructions  blood tests are requested for you today.  We'll let you know about the results. Please consider these measures for your health:  minimize alcohol.  Do not use tobacco products.  Have a colonoscopy at least every 10 years from age 44.  Keep firearms safely stored.  Always use seat belts.  have working smoke alarms in your home.  See an eye doctor and dentist regularly.  Never drive under the influence of alcohol or drugs (including prescription drugs).   Please return in 1 year.

## 2016-11-04 ENCOUNTER — Other Ambulatory Visit: Payer: Self-pay | Admitting: Endocrinology

## 2017-03-19 ENCOUNTER — Other Ambulatory Visit: Payer: Self-pay | Admitting: Endocrinology

## 2017-06-22 ENCOUNTER — Other Ambulatory Visit: Payer: Self-pay | Admitting: Endocrinology

## 2017-07-22 ENCOUNTER — Ambulatory Visit
Admission: RE | Admit: 2017-07-22 | Discharge: 2017-07-22 | Disposition: A | Discharge status: Home / Self Care | Payer: BLUE CROSS/BLUE SHIELD | Source: Ambulatory Visit | Attending: Endocrinology | Admitting: Endocrinology

## 2017-07-22 ENCOUNTER — Ambulatory Visit: Payer: BLUE CROSS/BLUE SHIELD | Admitting: Endocrinology

## 2017-07-22 VITALS — BP 142/82 | HR 73 | Wt 190.0 lb

## 2017-07-22 DIAGNOSIS — E119 Type 2 diabetes mellitus without complications: Secondary | ICD-10-CM | POA: Diagnosis not present

## 2017-07-22 DIAGNOSIS — R972 Elevated prostate specific antigen [PSA]: Secondary | ICD-10-CM

## 2017-07-22 DIAGNOSIS — M25512 Pain in left shoulder: Principal | ICD-10-CM

## 2017-07-22 DIAGNOSIS — G8929 Other chronic pain: Secondary | ICD-10-CM

## 2017-07-22 DIAGNOSIS — Z0000 Encounter for general adult medical examination without abnormal findings: Secondary | ICD-10-CM | POA: Diagnosis not present

## 2017-07-22 LAB — URINALYSIS, ROUTINE W REFLEX MICROSCOPIC
BILIRUBIN URINE: NEGATIVE
HGB URINE DIPSTICK: NEGATIVE
Ketones, ur: NEGATIVE
LEUKOCYTES UA: NEGATIVE
NITRITE: NEGATIVE
RBC / HPF: NONE SEEN (ref 0–?)
Specific Gravity, Urine: 1.02 (ref 1.000–1.030)
Urine Glucose: NEGATIVE
Urobilinogen, UA: 0.2 (ref 0.0–1.0)
pH: 6.5 (ref 5.0–8.0)

## 2017-07-22 LAB — CBC WITH DIFFERENTIAL/PLATELET
BASOS ABS: 0 10*3/uL (ref 0.0–0.1)
Basophils Relative: 0.5 % (ref 0.0–3.0)
EOS ABS: 0.1 10*3/uL (ref 0.0–0.7)
Eosinophils Relative: 1.6 % (ref 0.0–5.0)
HCT: 44 % (ref 39.0–52.0)
Hemoglobin: 13.9 g/dL (ref 13.0–17.0)
LYMPHS ABS: 2.2 10*3/uL (ref 0.7–4.0)
LYMPHS PCT: 46.4 % — AB (ref 12.0–46.0)
MCHC: 31.7 g/dL (ref 30.0–36.0)
MCV: 72.7 fl — AB (ref 78.0–100.0)
MONOS PCT: 7.9 % (ref 3.0–12.0)
Monocytes Absolute: 0.4 10*3/uL (ref 0.1–1.0)
NEUTROS PCT: 43.6 % (ref 43.0–77.0)
Neutro Abs: 2 10*3/uL (ref 1.4–7.7)
Platelets: 329 10*3/uL (ref 150.0–400.0)
RBC: 6.05 Mil/uL — AB (ref 4.22–5.81)
RDW: 15.3 % (ref 11.5–15.5)
WBC: 4.7 10*3/uL (ref 4.0–10.5)

## 2017-07-22 LAB — BASIC METABOLIC PANEL
BUN: 13 mg/dL (ref 6–23)
CALCIUM: 9.6 mg/dL (ref 8.4–10.5)
CO2: 29 mEq/L (ref 19–32)
Chloride: 101 mEq/L (ref 96–112)
Creatinine, Ser: 0.9 mg/dL (ref 0.40–1.50)
GFR: 110.33 mL/min (ref >60.00)
Glucose, Bld: 83 mg/dL (ref 70–99)
Potassium: 3.5 mEq/L (ref 3.5–5.1)
Sodium: 137 mEq/L (ref 135–145)

## 2017-07-22 LAB — HEPATIC FUNCTION PANEL
ALBUMIN: 4.2 g/dL (ref 3.5–5.2)
ALK PHOS: 67 U/L (ref 39–117)
ALT: 16 U/L (ref 0–53)
AST: 16 U/L (ref 0–37)
BILIRUBIN DIRECT: 0.1 mg/dL (ref 0.0–0.3)
Total Bilirubin: 0.7 mg/dL (ref 0.2–1.2)
Total Protein: 7.5 g/dL (ref 6.0–8.3)

## 2017-07-22 LAB — LIPID PANEL
Cholesterol: 136 mg/dL (ref 0–200)
HDL: 49.4 mg/dL (ref >39.00)
LDL Cholesterol: 71 mg/dL (ref 0–99)
NonHDL: 86.66
TRIGLYCERIDES: 78 mg/dL (ref 0.0–149.0)
Total CHOL/HDL Ratio: 3
VLDL: 15.6 mg/dL (ref 0.0–40.0)

## 2017-07-22 LAB — POCT GLYCOSYLATED HEMOGLOBIN (HGB A1C): Hemoglobin A1C: 5.7

## 2017-07-22 LAB — TSH: TSH: 0.95 u[IU]/mL (ref 0.35–4.50)

## 2017-07-22 MED ORDER — AMLODIPINE BESYLATE 2.5 MG PO TABS: 2.5000 mg | ORAL_TABLET | Freq: Every day | ORAL | 3 refills | Status: AC

## 2017-07-22 MED ORDER — OMEPRAZOLE 40 MG PO CPDR: 40.0000 mg | DELAYED_RELEASE_CAPSULE | Freq: Every day | ORAL | 2 refills | Status: DC

## 2017-07-22 MED ORDER — CLOTRIMAZOLE-BETAMETHASONE 1-0.05 % EX CREA: TOPICAL_CREAM | Freq: Three times a day (TID) | CUTANEOUS | 3 refills | Status: DC | PRN

## 2017-07-22 MED ORDER — NAPROXEN SODIUM 220 MG PO TABS: 220.0000 mg | ORAL_TABLET | Freq: Two times a day (BID) | ORAL | 2 refills | Status: DC | PRN

## 2017-07-22 NOTE — Patient Instructions (Addendum)
I have sent 4 prescriptions to your pharmacy: for an additional blood pressure pill, for the pain, for omeprazole (to protect your stomach), and to resume the skin cream.   blood tests and x-rays are requested for you today.  We'll let you know about the results. Please consider these measures for your health:  minimize alcohol.  Do not use tobacco products.  Have a colonoscopy at least every 10 years from age 62.  Keep firearms safely stored.  Always use seat belts.  have working smoke alarms in your home.  See an eye doctor and dentist regularly.  Never drive under the influence of alcohol or drugs (including prescription drugs).   Please come back for a follow-up appointment in 1 year.

## 2017-07-22 NOTE — Progress Notes (Signed)
Subjective:    Patient ID: [name], male    DOB: 04/24/1955, 62 y.o.   MRN: [id number]  HPI Pt is here for regular wellness examination, and is feeling pretty well in general, and says chronic med probs are stable, except as noted below Past Medical History:  Diagnosis Date  . COUGH DUE TO ACE INHIBITORS 09/25/2008  . DEPRESSION 11/12/2006  . ELECTROCARDIOGRAM, ABNORMAL 03/10/2008  . HYPERCHOLESTEROLEMIA 03/10/2008  . HYPERTENSION 11/12/2006   Echo (12/09) with EF 65%, no valvular abnormalities  . LEUKOPENIA, CHRONIC 03/10/2008    Past Surgical History:  Procedure Laterality Date  . COLONOSCOPY    . LUMBAR LAMINECTOMY/DECOMPRESSION MICRODISCECTOMY Right 04/25/2016   Procedure: Laminectomy for facet/synovial cyst - Lumbar four- Lumbar five - right;  Surgeon: Kary Kos, MD;  Location: [facility];  Service: Neurosurgery;  Laterality: Right;  . stress cardiolite  10/04/2001    Social History   Socioeconomic History  . Marital status: Married    Spouse name: Not on file  . Number of children: Not on file  . Years of education: Not on file  . Highest education level: Not on file  Occupational History  . Occupation: [employer]  Social Needs  . Financial resource strain: Not on file  . Food insecurity:    Worry: Not on file    Inability: Not on file  . Transportation needs:    Medical: Not on file    Non-medical: Not on file  Tobacco Use  . Smoking status: Former Smoker    Last attempt to quit: 04/07/2005    Years since quitting: 12.3  . Smokeless tobacco: Never Used  Substance and Sexual Activity  . Alcohol use: No    Alcohol/week: 0.0 oz  . Drug use: No    Types: Cocaine    Comment: pt denies using any recreational or street drugs (04/18/2016)  . Sexual activity: Yes  Lifestyle  . Physical activity:    Days per week: Not on file    Minutes per session: Not on file  . Stress: Not on file  Relationships  . Social connections:    Talks on phone: Not on file   Gets together: Not on file    Attends religious service: Not on file    Active member of club or organization: Not on file    Attends meetings of clubs or organizations: Not on file    Relationship status: Not on file  . Intimate partner violence:    Fear of current or ex partner: Not on file    Emotionally abused: Not on file    Physically abused: Not on file    Forced sexual activity: Not on file  Other Topics Concern  . 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 atorvastatin (LIPITOR) 40 MG tablet TAKE 1 TABLET (40 MG TOTAL) BY MOUTH DAILY. 30 tablet 4  . losartan-hydrochlorothiazide (HYZAAR) 100-25 MG tablet TAKE 1 TABLET BY MOUTH EVERY DAY 30 tablet 7  . cyclobenzaprine (FLEXERIL) 10 MG tablet Take 10 mg by mouth 2 (two) times daily as needed for muscle spasms.    Marland Kitchen triamcinolone cream (KENALOG) 0.1 % APPLY TOPICALLY 3 TIMES DAILY AS NEEDED FOR ITCHING (Patient not taking: Reported on 07/22/2017) 45 g 0  No current facility-administered medications on file prior to visit.     Allergies  Allergen Reactions  . Lisinopril Cough    Family History  Problem Relation Age of Onset  . Cancer Mother        Ovarian Cancer  . Ovarian cancer Mother   . Hypertension Father   . Alcohol abuse Maternal Aunt   . Alcohol abuse Maternal Uncle   . Alcohol abuse Cousin   . Heart disease Neg Hx   . Colon cancer Neg Hx   . Colon polyps Neg Hx   . Esophageal cancer Neg Hx   . Rectal cancer Neg Hx   . Stomach cancer Neg Hx     BP (!) 142/82 (BP Location: Left Arm, Patient Position: Sitting, Cuff Size: Normal)   Pulse 73   Wt 190 lb (86.2 kg)   SpO2 95%   BMI 30.67 kg/m    Review of Systems Denies fever, fatigue, visual loss, hearing loss, chest pain, sob, back pain, depression, cold  intolerance, BRBPR, hematuria, syncope, numbness, allergy sxs, easy bruising, and dry skin     Objective:   Physical Exam HEAD: head: no deformity eyes: no periorbital swelling, no proptosis external nose and ears are normal mouth: no lesion seen NECK: supple, thyroid is not enlarged CHEST WALL: no deformity LUNGS: clear to auscultation BREASTS:  No gynecomastia CV: reg rate and rhythm, no murmur ABD: abdomen is soft, nontender.  no hepatosplenomegaly.  not distended.  no hernia  RECTAL/PROSTATE: sees urology MUSCULOSKELETAL: muscle bulk and strength are grossly normal.  no obvious joint swelling.  gait is normal and steady EXTEMITIES: no deformity.  no ulcer on the feet.  feet are of normal color and temp.  no edema PULSES: dorsalis pedis intact bilat.  no carotid bruit NEURO:  cn 2-12 grossly intact.   readily moves all 4's.  sensation is intact to touch on the feet SKIN:  Normal texture and temperature.  No  suspicious lesion is visible.   NODES:  None palpable at the neck PSYCH: alert, well-oriented.  Does not appear anxious nor depressed.  I personally reviewed electrocardiogram tracing (today): Indication: DM Impression: NSR.  No MI.  High voltage. Compared to: no significant change      Assessment & Plan:  Wellness visit today, with problems stable, except as noted.  SEPARATE EVALUATION FOLLOWS--EACH PROBLEM HERE IS NEW, NOT RESPONDING TO TREATMENT, OR POSES SIGNIFICANT RISK TO THE PATIENT'S HEALTH: HISTORY OF THE PRESENT ILLNESS: Pt states 2 weeks of moderate pain at the left shoulder.  Worse with ROM.  No injury.  No assoc numbness.   PAST MEDICAL HISTORY Past Medical History:  Diagnosis Date  . COUGH DUE TO ACE INHIBITORS 09/25/2008  . DEPRESSION 11/12/2006  . ELECTROCARDIOGRAM, ABNORMAL 03/10/2008  . HYPERCHOLESTEROLEMIA 03/10/2008  . HYPERTENSION 11/12/2006   Echo (12/09) with EF 65%, no valvular abnormalities  . LEUKOPENIA, CHRONIC 03/10/2008    Past Surgical  History:  Procedure Laterality Date  . COLONOSCOPY    . LUMBAR LAMINECTOMY/DECOMPRESSION MICRODISCECTOMY Right 04/25/2016   Procedure: Laminectomy for facet/synovial cyst - Lumbar four- Lumbar five - right;  Surgeon: Kary Kos, MD;  Location: [facility];  Service: Neurosurgery;  Laterality: Right;  . stress cardiolite  10/04/2001    Social History   Socioeconomic History  . Marital status: Married    Spouse name: Not on file  . Number of children: Not on file  . Years of education: Not on file  . Highest education level: Not  on file  Occupational History  . Occupation: [employer]  Social Needs  . Financial resource strain: Not on file  . Food insecurity:    Worry: Not on file    Inability: Not on file  . Transportation needs:    Medical: Not on file    Non-medical: Not on file  Tobacco Use  . Smoking status: Former Smoker    Last attempt to quit: 04/07/2005    Years since quitting: 12.3  . Smokeless tobacco: Never Used  Substance and Sexual Activity  . Alcohol use: No    Alcohol/week: 0.0 oz  . Drug use: No    Types: Cocaine    Comment: pt denies using any recreational or street drugs (04/18/2016)  . Sexual activity: Yes  Lifestyle  . Physical activity:    Days per week: Not on file    Minutes per session: Not on file  . Stress: Not on file  Relationships  . Social connections:    Talks on phone: Not on file    Gets together: Not on file    Attends religious service: Not on file    Active member of club or organization: Not on file    Attends meetings of clubs or organizations: Not on file    Relationship status: Not on file  . Intimate partner violence:    Fear of current or ex partner: Not on file    Emotionally abused: Not on file    Physically abused: Not on file    Forced sexual activity: Not on file  Other Topics Concern  . 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 atorvastatin (LIPITOR) 40 MG tablet TAKE 1 TABLET (40 MG TOTAL) BY MOUTH DAILY. 30 tablet 4  . losartan-hydrochlorothiazide (HYZAAR) 100-25 MG tablet TAKE 1 TABLET BY MOUTH EVERY DAY 30 tablet 7  . cyclobenzaprine (FLEXERIL) 10 MG tablet Take 10 mg by mouth 2 (two) times daily as needed for muscle spasms.    Marland Kitchen triamcinolone cream (KENALOG) 0.1 % APPLY TOPICALLY 3 TIMES DAILY AS NEEDED FOR ITCHING (Patient not taking: Reported on 07/22/2017) 45 g 0   No current facility-administered medications on file prior to visit.     Allergies  Allergen Reactions  . Lisinopril Cough    Family History  Problem Relation Age of Onset  . Cancer Mother        Ovarian Cancer  . Ovarian cancer Mother   . Hypertension Father   . Alcohol abuse Maternal Aunt   . Alcohol abuse Maternal Uncle   . Alcohol abuse Cousin   . Heart disease Neg Hx   . Colon cancer Neg Hx   . Colon polyps Neg Hx   . Esophageal cancer Neg Hx   . Rectal cancer Neg Hx   . Stomach cancer Neg Hx     BP (!) 142/82 (BP Location: Left Arm, Patient Position: Sitting, Cuff Size: Normal)   Pulse 73   Wt 190 lb (86.2 kg)   SpO2 95%   BMI 30.67 kg/m   REVIEW OF SYSTEMS: Right leg itching has recurred.  He says lotrisone worked better than TAC PHYSICAL EXAMINATION: VITAL SIGNS:  See vs page GENERAL: no distress.  Left shoulder: full ROM, but ROM slightly worsens  the pain.   SKIN: right ant tibial area: 15x2 cm longitudinal eczematous rash.   LAB/XRAY RESULTS: Lab Results  Component Value Date   HGBA1C 5.7 07/22/2017   IMPRESSION: Rash, recurrent Shoulder pain, new HTN: he needs increased rx DM: well-controlled elev PSA: recheck today PLAN:  I have sent 4 prescriptions to your pharmacy: for an additional blood pressure pill, for the pain, for omeprazole (to protect your stomach), and to resume the skin cream.     blood tests and x-rays are requested for you today.  We'll let you know about the results

## 2017-11-30 ENCOUNTER — Other Ambulatory Visit: Payer: Self-pay | Admitting: Endocrinology

## 2018-01-24 ENCOUNTER — Other Ambulatory Visit: Payer: Self-pay | Admitting: Endocrinology

## 2018-01-25 ENCOUNTER — Other Ambulatory Visit: Payer: Self-pay | Admitting: Endocrinology

## 2018-02-06 ENCOUNTER — Other Ambulatory Visit: Payer: Self-pay | Admitting: Endocrinology

## 2018-05-26 ENCOUNTER — Other Ambulatory Visit: Payer: Self-pay | Admitting: Endocrinology

## 2018-07-23 ENCOUNTER — Ambulatory Visit: Payer: BLUE CROSS/BLUE SHIELD | Admitting: Endocrinology

## 2018-08-04 IMAGING — DX DG SHOULDER 2+V*L*
3 series · 3 of 3 positions shown · non-contrast
Comparison: None.

CLINICAL DATA: Chronic left shoulder pain with decreased range of
motion.

EXAM:
LEFT SHOULDER - 2+ VIEW

[dg shoulder left (1 of 3)]
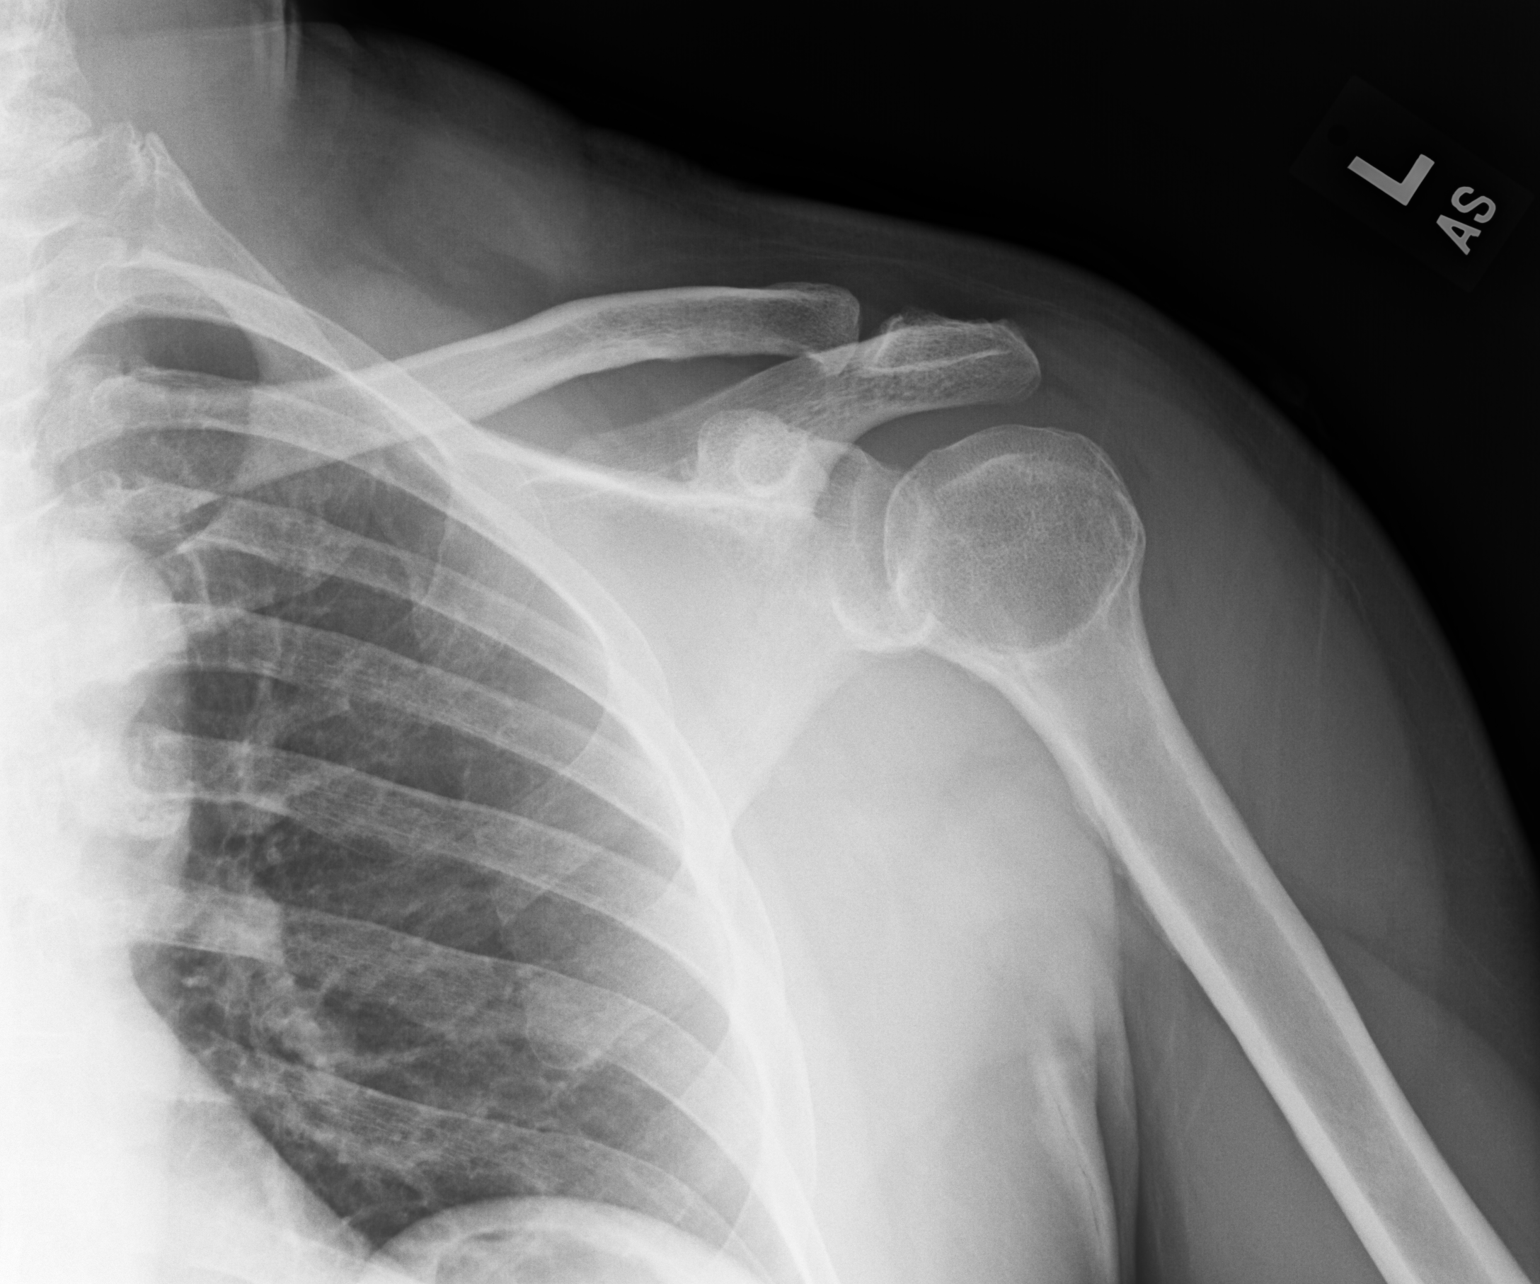

[dg shoulder left (2 of 3)]
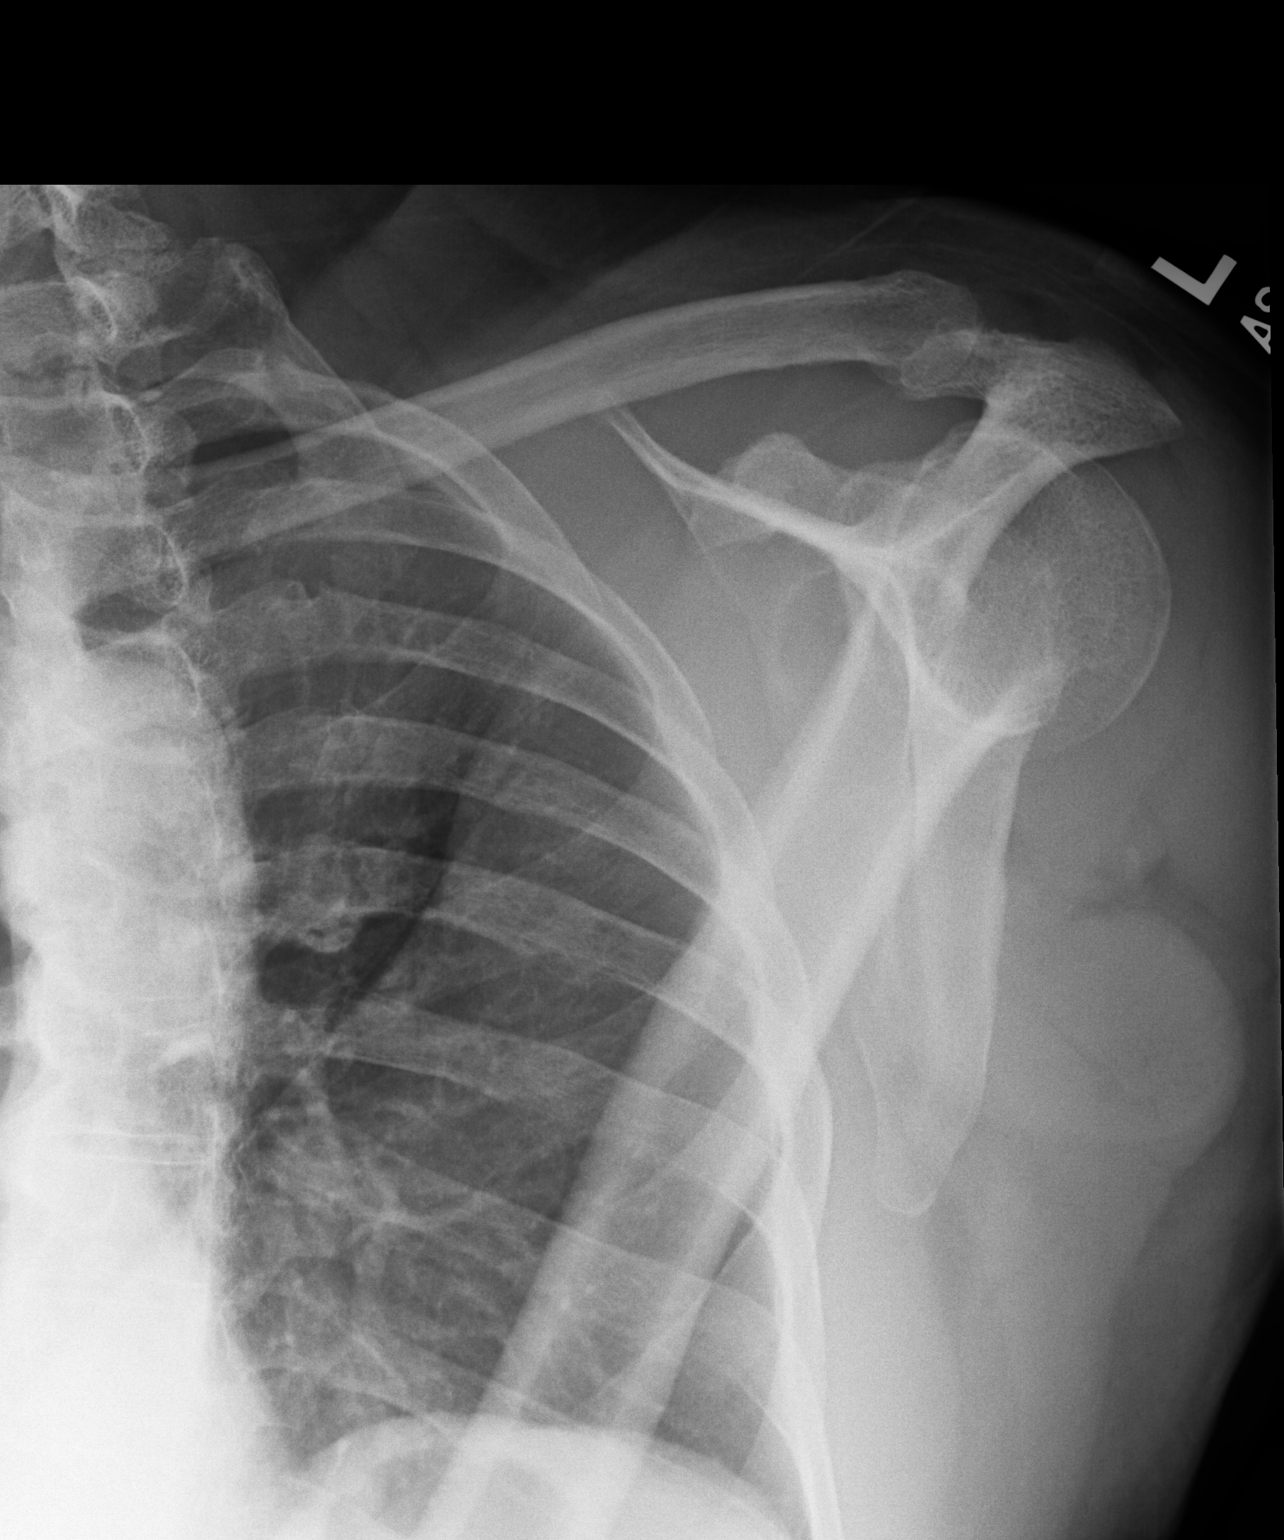

[dg shoulder left (3 of 3)]
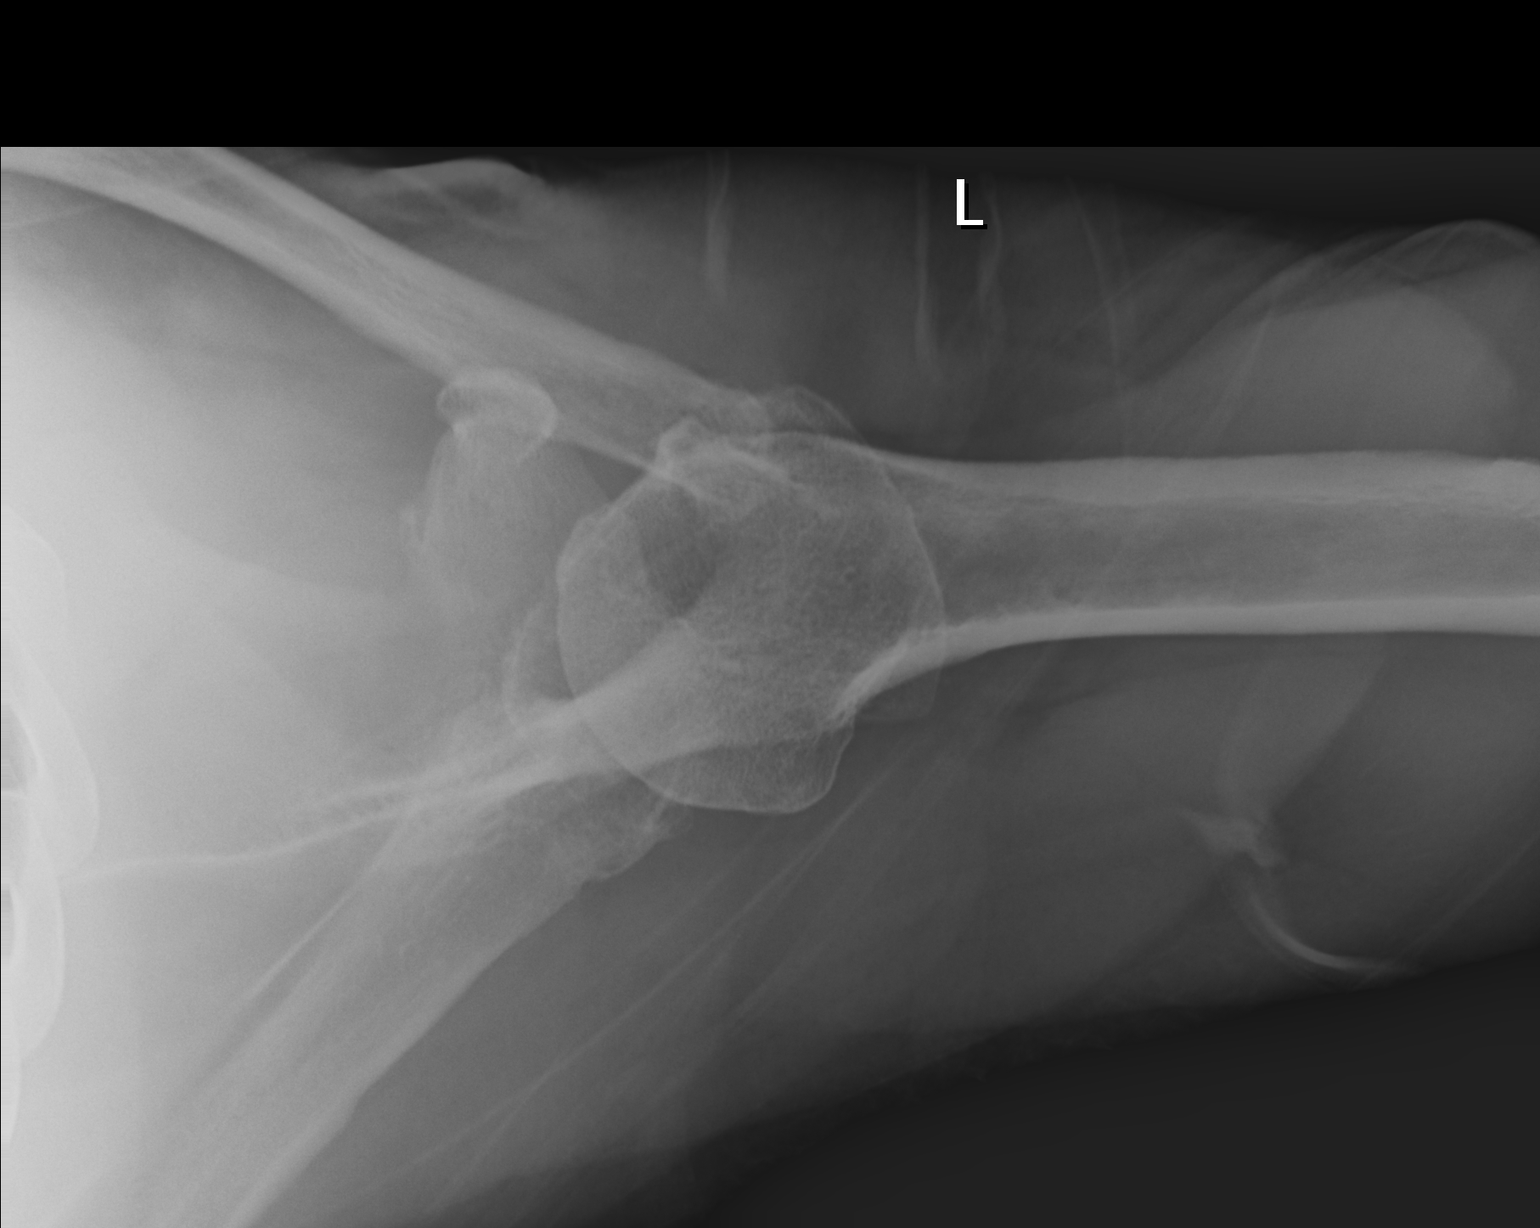

[3 of 3 positions shown; findings below may reference images not displayed]

FINDINGS: No acute fracture or dislocation. Mild glenohumeral and
acromioclavicular joint space narrowing with small marginal
osteophytes. Bone mineralization is normal. Soft tissues are
unremarkable.
IMPRESSION: Mild glenohumeral and acromioclavicular osteoarthritis.

## 2018-08-05 ENCOUNTER — Telehealth: Payer: Self-pay

## 2018-08-05 NOTE — Telephone Encounter (Signed)
Overdue for an appt. LVM requesting returned call. 

## 2018-08-31 ENCOUNTER — Telehealth: Payer: Self-pay

## 2018-08-31 NOTE — Telephone Encounter (Signed)
SECOND ATTEMPT  Called pt home and mobile#'s. VM either full or not set up

## 2018-09-09 ENCOUNTER — Other Ambulatory Visit: Payer: Self-pay

## 2018-09-13 ENCOUNTER — Ambulatory Visit (INDEPENDENT_AMBULATORY_CARE_PROVIDER_SITE_OTHER): Payer: BC Managed Care – PPO | Admitting: Endocrinology

## 2018-09-13 ENCOUNTER — Other Ambulatory Visit: Payer: Self-pay

## 2018-09-13 ENCOUNTER — Encounter: Payer: Self-pay | Admitting: Endocrinology

## 2018-09-13 VITALS — BP 172/104 | HR 89 | Temp 98.3°F | Wt 187.0 lb

## 2018-09-13 DIAGNOSIS — R972 Elevated prostate specific antigen [PSA]: Secondary | ICD-10-CM | POA: Diagnosis not present

## 2018-09-13 DIAGNOSIS — E119 Type 2 diabetes mellitus without complications: Secondary | ICD-10-CM

## 2018-09-13 LAB — POCT GLYCOSYLATED HEMOGLOBIN (HGB A1C): Hemoglobin A1C: 6.1 % — AB (ref 4.0–5.6)

## 2018-09-13 MED ORDER — TRIAMCINOLONE ACETONIDE 0.1 % EX CREA: TOPICAL_CREAM | CUTANEOUS | 0 refills | Status: DC

## 2018-09-13 NOTE — Progress Notes (Signed)
Subjective:    Patient ID: [name], male    DOB: 06-08-1955, 63 y.o.   MRN: [id number]  HPI Pt returns for f/u of diabetes mellitus: DM type: 2 Dx'ed: 4098 Complications: none Therapy: no medication now.  DKA: never Severe hypoglycemia: never Pancreatitis: never Pancreatic imaging: never Other: he has never been on insulin.   Interval history: he says he did not tale hyzaar this am.  He does not recall name of new PCP.   Past Medical History:  Diagnosis Date  . COUGH DUE TO ACE INHIBITORS 09/25/2008  . DEPRESSION 11/12/2006  . ELECTROCARDIOGRAM, ABNORMAL 03/10/2008  . HYPERCHOLESTEROLEMIA 03/10/2008  . HYPERTENSION 11/12/2006   Echo (12/09) with EF 65%, no valvular abnormalities  . LEUKOPENIA, CHRONIC 03/10/2008    Past Surgical History:  Procedure Laterality Date  . COLONOSCOPY    . LUMBAR LAMINECTOMY/DECOMPRESSION MICRODISCECTOMY Right 04/25/2016   Procedure: Laminectomy for facet/synovial cyst - Lumbar four- Lumbar five - right;  Surgeon: Kary Kos, MD;  Location: [facility];  Service: Neurosurgery;  Laterality: Right;  . stress cardiolite  10/04/2001    Social History   Socioeconomic History  . Marital status: Married    Spouse name: Not on file  . Number of children: Not on file  . Years of education: Not on file  . Highest education level: Not on file  Occupational History  . Occupation: [employer]  Social Needs  . Financial resource strain: Not on file  . Food insecurity:    Worry: Not on file    Inability: Not on file  . Transportation needs:    Medical: Not on file    Non-medical: Not on file  Tobacco Use  . Smoking status: Former Smoker    Last attempt to quit: 04/07/2005    Years since quitting: 13.4  . Smokeless tobacco: Never Used  Substance and Sexual Activity  . Alcohol use: No    Alcohol/week: 0.0 standard drinks  . Drug use: No    Types: Cocaine    Comment: pt denies using any recreational or street drugs (04/18/2016)  . Sexual activity:  Yes  Lifestyle  . Physical activity:    Days per week: Not on file    Minutes per session: Not on file  . Stress: Not on file  Relationships  . Social connections:    Talks on phone: Not on file    Gets together: Not on file    Attends religious service: Not on file    Active member of club or organization: Not on file    Attends meetings of clubs or organizations: Not on file    Relationship status: Not on file  . Intimate partner violence:    Fear of current or ex partner: Not on file    Emotionally abused: Not on file    Physically abused: Not on file    Forced sexual activity: Not on file  Other Topics Concern  . 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 amLODipine (NORVASC) 2.5 MG tablet Take 1 tablet (2.5 mg total) by mouth daily. 90 tablet 3  . clotrimazole-betamethasone (LOTRISONE) cream Apply topically 3 (three) times daily as needed. For rash 45 g 3  . cyclobenzaprine (FLEXERIL) 10 MG tablet Take 10  mg by mouth 2 (two) times daily as needed for muscle spasms.    Marland Kitchen LIPITOR 40 MG tablet TAKE 1 TABLET (40 MG TOTAL) BY MOUTH DAILY. 90 tablet 2  . losartan-hydrochlorothiazide (HYZAAR) 100-25 MG tablet TAKE 1 TABLET BY MOUTH EVERY DAY 30 tablet 7  . naproxen sodium (ALEVE) 220 MG tablet Take 1 tablet (220 mg total) by mouth 2 (two) times daily as needed. 60 tablet 2  . omeprazole (PRILOSEC) 40 MG capsule TAKE 1 CAPSULE BY MOUTH EVERY DAY 30 capsule 2   No current facility-administered medications on file prior to visit.     Allergies  Allergen Reactions  . Lisinopril Cough    Family History  Problem Relation Age of Onset  . Cancer Mother        Ovarian Cancer  . Ovarian cancer Mother   . Hypertension Father   . Alcohol abuse Maternal Aunt   . Alcohol abuse Maternal Uncle    . Alcohol abuse Cousin   . Heart disease Neg Hx   . Colon cancer Neg Hx   . Colon polyps Neg Hx   . Esophageal cancer Neg Hx   . Rectal cancer Neg Hx   . Stomach cancer Neg Hx     BP (!) 172/104 (BP Location: Left Arm, Patient Position: Sitting, Cuff Size: Normal)   Pulse 89   Temp 98.3 F (36.8 C) (Oral)   Wt 187 lb (84.8 kg)   SpO2 98%   BMI 30.18 kg/m    Review of Systems Denies chest pain and sob.       Objective:   Physical Exam VITAL SIGNS:  See vs page GENERAL: no distress Pulses: dorsalis pedis intact bilat.   MSK: no deformity of the feet CV: no leg edema Skin:  no ulcer on the feet.  normal color and temp on the feet.  Mild eczematous rash on the right leg.   Neuro: sensation is intact to touch on the feet    Lab Results  Component Value Date   CREATININE 0.90 07/22/2017   BUN 13 07/22/2017   NA 137 07/22/2017   K 3.5 07/22/2017   CL 101 07/22/2017   CO2 29 07/22/2017   Lab Results  Component Value Date   HGBA1C 6.1 (A) 09/13/2018      Assessment & Plan:  Type 2 DM: stable HTN: worse Rash, new.  uncertain etiology  Patient Instructions  Your blood pressure is high today.  Please see your primary care provider soon, to have it rechecked.   I have sent a prescription to your pharmacy, for the rash. No medication is needed for the diabetes now.  good diet and exercise significantly improves your blood sugar.  I would be happy to see you back here as needed.

## 2018-09-13 NOTE — Patient Instructions (Addendum)
Your blood pressure is high today.  Please see your primary care provider soon, to have it rechecked.   I have sent a prescription to your pharmacy, for the rash. No medication is needed for the diabetes now.  good diet and exercise significantly improves your blood sugar.  I would be happy to see you back here as needed.

## 2019-03-02 ENCOUNTER — Other Ambulatory Visit: Payer: Self-pay | Admitting: Endocrinology

## 2019-03-03 NOTE — Telephone Encounter (Signed)
Please refill x 1 Further refills would have to be considered by new PCP  

## 2020-02-06 ENCOUNTER — Encounter: Payer: Self-pay | Admitting: Family Medicine

## 2020-02-06 ENCOUNTER — Telehealth (INDEPENDENT_AMBULATORY_CARE_PROVIDER_SITE_OTHER): Payer: BC Managed Care – PPO | Admitting: Family Medicine

## 2020-02-06 VITALS — Ht 66.0 in | Wt 184.0 lb

## 2020-02-06 DIAGNOSIS — Z7721 Contact with and (suspected) exposure to potentially hazardous body fluids: Secondary | ICD-10-CM | POA: Diagnosis not present

## 2020-02-06 DIAGNOSIS — M545 Low back pain, unspecified: Secondary | ICD-10-CM

## 2020-02-06 MED ORDER — METRONIDAZOLE 500 MG PO TABS: ORAL_TABLET | ORAL | 0 refills | Status: AC

## 2020-02-06 MED ORDER — METHOCARBAMOL 500 MG PO TABS: 500.0000 mg | ORAL_TABLET | Freq: Three times a day (TID) | ORAL | 1 refills | Status: AC | PRN

## 2020-02-06 MED ORDER — IBUPROFEN 600 MG PO TABS: 600.0000 mg | ORAL_TABLET | Freq: Three times a day (TID) | ORAL | 0 refills | Status: AC | PRN

## 2020-02-06 NOTE — Progress Notes (Signed)
New Patient Office Visit  Subjective:  Patient ID: [name], male    DOB: [birth year]  Age: 64 y.o. MRN: [id number]  CC:  Chief Complaint  Patient presents with  . Establish Care    New patient establish care, patient states that he sometimes have lower back and leg pains that come and go.     HPI [name] presents for follow up of trichomonas exposure per his wife. She has been treat but he has not. He denies discharge or dysuria. He is aware that this is sexually transmitted.  Intermittent lft lower back pain. Non radiating. Denies paresthesias or weakness. No bowel or bladder incontinence. Ho lumbar spine surgery 3 years ago. Feels occ muscle spasms. Ho DM, elevated cholesterol and htn.  Past Medical History:  Diagnosis Date  . COUGH DUE TO ACE INHIBITORS 09/25/2008  . DEPRESSION 11/12/2006  . ELECTROCARDIOGRAM, ABNORMAL 03/10/2008  . HYPERCHOLESTEROLEMIA 03/10/2008  . HYPERTENSION 11/12/2006   Echo (12/09) with EF 65%, no valvular abnormalities  . LEUKOPENIA, CHRONIC 03/10/2008    Past Surgical History:  Procedure Laterality Date  . COLONOSCOPY    . LUMBAR LAMINECTOMY/DECOMPRESSION MICRODISCECTOMY Right 04/25/2016   Procedure: Laminectomy for facet/synovial cyst - Lumbar four- Lumbar five - right;  Surgeon: Kary Kos, MD;  Location: [facility];  Service: Neurosurgery;  Laterality: Right;  . stress cardiolite  10/04/2001    Tristan History  Problem Relation Age of Onset  . Cancer Mother        Ovarian Cancer  . Ovarian cancer Mother   . Hypertension Father   . Alcohol abuse Maternal Aunt   . Alcohol abuse Maternal Uncle   . Alcohol abuse Cousin   . Heart disease Neg Hx   . Colon cancer Neg Hx   . Colon polyps Neg Hx   . Esophageal cancer Neg Hx   . Rectal cancer Neg Hx   . Stomach cancer Neg Hx     Social History   Socioeconomic History  . Marital status: Married    Spouse name: Not on file  . Number of children: Not on file  . Years of education: Not on  file  . Highest education level: Not on file  Occupational History  . Occupation: [employer]  Tobacco Use  . Smoking status: Former Smoker    Quit date: 04/07/2005    Years since quitting: 14.8  . Smokeless tobacco: Never Used  Vaping Use  . Vaping Use: Never used  Substance and Sexual Activity  . Alcohol use: Not Currently    Alcohol/week: 0.0 standard drinks  . Drug use: Not Currently    Types: Cocaine    Comment: pt denies using any recreational or street drugs (04/18/2016)  . Sexual activity: Yes  Other Topics Concern  . 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 Difficulty of Paying Living Expenses: Not on file  Food Insecurity:   . Worried About Charity fundraiser in the Last Year: Not on file  . Ran Out of Food in the Last Year: Not on file  Transportation Needs:   . Lack of Transportation (Medical): Not on file  . Lack of Transportation (Non-Medical): Not on file  Physical Activity:   . Days of Exercise per Week: Not on  file  . Minutes of Exercise per Session: Not on file  Stress:   . Feeling of Stress : Not on file  Social Connections:   . Frequency of Communication with Friends and Tristan: Not on file  . Frequency of Social Gatherings with Friends and Tristan: Not on file  . Attends Religious Services: Not on file  . Active Member of Clubs or Organizations: Not on file  . Attends Archivist Meetings: Not on file  . Marital Status: Not on file  Intimate Partner Violence:   . Fear of Current or Ex-Partner: Not on file  . Emotionally Abused: Not on file  . Physically Abused: Not on file  . Sexually Abused: Not on file    ROS Review of Systems  Constitutional: Negative.   HENT: Negative.   Respiratory: Negative.   Cardiovascular: Negative.   Endocrine: Negative for polyphagia  and polyuria.  Genitourinary: Positive for difficulty urinating. Negative for decreased urine volume, discharge, dysuria, flank pain, frequency, genital sores, penile pain and urgency.  Musculoskeletal: Positive for back pain and myalgias. Negative for arthralgias.  Skin: Negative.   Neurological: Negative for weakness and numbness.  Hematological: Does not bruise/bleed easily.  Psychiatric/Behavioral: Negative.     Objective:   Today's Vitals: Ht 5\' 6"  (1.676 m)   Wt 184 lb (83.5 kg)   BMI 29.70 kg/m   Physical Exam Vitals and nursing note reviewed.  Constitutional:      Appearance: Normal appearance.  HENT:     Head: Normocephalic and atraumatic.     Right Ear: External ear normal.     Left Ear: External ear normal.  Eyes:     General:        Right eye: No discharge.        Left eye: No discharge.     Conjunctiva/sclera: Conjunctivae normal.  Pulmonary:     Effort: Pulmonary effort is normal.  Musculoskeletal:     Right lower leg: No edema.     Left lower leg: No edema.  Skin:    General: Skin is warm and dry.  Neurological:     Mental Status: He is alert and oriented to person, place, and time.  Psychiatric:        Mood and Affect: Mood normal.        Behavior: Behavior normal.     Assessment & Plan:   Problem List Items Addressed This Visit      Other   Left-sided low back pain without sciatica   Relevant Medications   ibuprofen (ADVIL) 600 MG tablet   methocarbamol (ROBAXIN) 500 MG tablet   Patient exposure to body fluids - Primary   Relevant Medications   metroNIDAZOLE (FLAGYL) 500 MG tablet   Other Relevant Orders   HIV Antibody (routine testing w rflx)   RPR   Urine cytology ancillary only      Outpatient Encounter Medications as of 02/06/2020  Medication Sig  . amLODipine (NORVASC) 2.5 MG tablet Take 1 tablet (2.5 mg total) by mouth daily.  Marland Kitchen atorvastatin (LIPITOR) 10 MG tablet Take 10 mg by mouth daily.  Marland Kitchen desonide (DESOWEN) 0.05 % ointment  Apply 1 application topically 2 (two) times daily.  . hydrochlorothiazide (HYDRODIURIL) 25 MG tablet Take 25 mg by mouth daily.  Marland Kitchen LOSARTAN POTASSIUM PO Take 100 capsules by mouth daily.  . clotrimazole-betamethasone (LOTRISONE) cream Apply to affected area 3 times daily as needed for rash. *Further refills should come from PCP.* (Patient not taking: Reported on  02/06/2020)  . ibuprofen (ADVIL) 600 MG tablet Take 1 tablet (600 mg total) by mouth every 8 (eight) hours as needed. With food.  Marland Kitchen LIPITOR 40 MG tablet TAKE 1 TABLET (40 MG TOTAL) BY MOUTH DAILY. (Patient not taking: Reported on 02/06/2020)  . losartan-hydrochlorothiazide (HYZAAR) 100-25 MG tablet TAKE 1 TABLET BY MOUTH EVERY DAY (Patient not taking: Reported on 02/06/2020)  . methocarbamol (ROBAXIN) 500 MG tablet Take 1 tablet (500 mg total) by mouth every 8 (eight) hours as needed for muscle spasms.  . metroNIDAZOLE (FLAGYL) 500 MG tablet Take 4 pills at once with food. No alcohol 24 hours prior and 24 hours after.  Marland Kitchen omeprazole (PRILOSEC) 40 MG capsule TAKE 1 CAPSULE BY MOUTH EVERY DAY (Patient not taking: Reported on 02/06/2020)  . [DISCONTINUED] cyclobenzaprine (FLEXERIL) 10 MG tablet Take 10 mg by mouth 2 (two) times daily as needed for muscle spasms. (Patient not taking: Reported on 02/06/2020)  . [DISCONTINUED] naproxen sodium (ALEVE) 220 MG tablet Take 1 tablet (220 mg total) by mouth 2 (two) times daily as needed.  . [DISCONTINUED] triamcinolone cream (KENALOG) 0.1 % Apply topically 3 times daily as needed for itching.*Further refills should come from PCP.*   No facility-administered encounter medications on file as of 02/06/2020.    Follow-up: Return in about 3 months (around 05/08/2020), or return to clinic for blood work and in next 3 months for a physical..   Libby Maw, MD   Virtual Visit via Video Note  I connected with [name] on 02/06/20 at  1:30 PM EDT by a video enabled telemedicine application and  verified that I am speaking with the correct person using two identifiers.  Location: Patient: Home alone.  Provider: home   I discussed the limitations of evaluation and management by telemedicine and the availability of in person appointments. The patient expressed understanding and agreed to proceed.  History of Present Illness:    Observations/Objective:   Assessment and Plan:   Follow Up Instructions:    I discussed the assessment and treatment plan with the patient. The patient was provided an opportunity to ask questions and all were answered. The patient agreed with the plan and demonstrated an understanding of the instructions.   The patient was advised to call back or seek an in-person evaluation if the symptoms worsen or if the condition fails to improve as anticipated.  I provided 30 minutes of non-face-to-face time during this encounter.   Libby Maw, MD

## 2020-02-16 ENCOUNTER — Ambulatory Visit: Payer: BC Managed Care – PPO | Admitting: Family Medicine

## 2021-04-13 ENCOUNTER — Encounter: Payer: Self-pay | Admitting: Gastroenterology

## 2021-10-06 ENCOUNTER — Other Ambulatory Visit: Payer: Self-pay

## 2021-10-06 ENCOUNTER — Ambulatory Visit (HOSPITAL_COMMUNITY)
Admission: EM | Admit: 2021-10-06 | Discharge: 2021-10-06 | Disposition: A | Discharge status: Home / Self Care | Payer: BC Managed Care – PPO | Attending: Physician Assistant | Admitting: Physician Assistant

## 2021-10-06 ENCOUNTER — Encounter (HOSPITAL_COMMUNITY): Payer: Self-pay | Admitting: *Deleted

## 2021-10-06 DIAGNOSIS — K2900 Acute gastritis without bleeding: Secondary | ICD-10-CM | POA: Diagnosis not present

## 2021-10-06 DIAGNOSIS — R1010 Upper abdominal pain, unspecified: Secondary | ICD-10-CM

## 2021-10-06 MED ORDER — ALUM & MAG HYDROXIDE-SIMETH 200-200-20 MG/5ML PO SUSP
ORAL | Status: AC
  Filled 2021-10-06: qty 30

## 2021-10-06 MED ORDER — LIDOCAINE VISCOUS HCL 2 % MT SOLN
OROMUCOSAL | Status: AC
  Filled 2021-10-06: qty 15

## 2021-10-06 MED ORDER — SUCRALFATE 1 GM/10ML PO SUSP: 1.0000 g | Freq: Three times a day (TID) | ORAL | 0 refills | Status: AC

## 2021-10-06 MED ORDER — LIDOCAINE VISCOUS HCL 2 % MT SOLN
15.0000 mL | Freq: Once | OROMUCOSAL | Status: AC
  Administered 2021-10-06: 15 mL via ORAL

## 2021-10-06 MED ORDER — ALUM & MAG HYDROXIDE-SIMETH 200-200-20 MG/5ML PO SUSP
30.0000 mL | Freq: Once | ORAL | Status: AC
  Administered 2021-10-06: 30 mL via ORAL

## 2021-10-06 MED ORDER — PANTOPRAZOLE SODIUM 40 MG PO TBEC: 40.0000 mg | DELAYED_RELEASE_TABLET | Freq: Every day | ORAL | 0 refills | Status: AC

## 2021-10-06 NOTE — ED Provider Notes (Signed)
[facility]    CSN: [id number] Arrival date & time: 10/06/21  1009      History   Chief Complaint Chief Complaint  Patient presents with   Abdominal Pain   Nausea    HPI [name] is a 66 y.o. male.   Patient presents today with a 4-day history of abdominal upset and decreased appetite.  He does report occasional nausea but has not had any vomiting.  Denies changes to bowels with last bowel movement approximately 2 days ago and he denies any blood or mucus.  Reports pain is currently rated 2/3 on a 0-10 pain scale but increases at times to 6.  He has not identified any worsening or triggering factors.  He denies history of GI condition including GERD.  Does not drink alcohol.  He has started Naprosyn regularly due to arthritis but denies additional NSAID use.  He denies any melena, hematochezia, hematemesis.  He has not tried any over-the-counter medications for symptom management.  He denies previous history of abdominal surgery and still has gallbladder and appendix.  Denies any recent antibiotic use, recent travel, suspicious food intake, known sick contacts, dietary changes.    Past Medical History:  Diagnosis Date   COUGH DUE TO ACE INHIBITORS 09/25/2008   DEPRESSION 11/12/2006   ELECTROCARDIOGRAM, ABNORMAL 03/10/2008   HYPERCHOLESTEROLEMIA 03/10/2008   HYPERTENSION 11/12/2006   Echo (12/09) with EF 65%, no valvular abnormalities   LEUKOPENIA, CHRONIC 03/10/2008    Patient Active Problem List   Diagnosis Date Noted   Patient exposure to body fluids 02/06/2020   Synovial cyst of lumbar facet joint 04/25/2016   Acute right lumbar radiculopathy 10/29/2015   Cervical radiculopathy 08/30/2015   Lumbar degenerative disc disease 08/30/2015   Bilateral hip joint arthritis 08/30/2015   Shoulder pain, left 08/13/2015   Left-sided low back pain without sciatica 06/08/2015   Pain in joint, pelvic region and thigh 06/08/2015   Numbness 12/30/2013   Routine general  medical examination at a health care facility 12/30/2013   Alcohol dependence ([facility]) 02/16/2012   Elevated PSA 12/11/2010   Encounter for long-term (current) use of other medications 12/10/2010   Screening for prostate cancer 12/10/2010   COUGH DUE TO ACE INHIBITORS 09/25/2008   Diabetes ([place]) 03/10/2008   HYPERCHOLESTEROLEMIA 03/10/2008   LEUKOPENIA, CHRONIC 03/10/2008   ELECTROCARDIOGRAM, ABNORMAL 03/10/2008   DEPRESSION 11/12/2006   Essential hypertension 11/12/2006    Past Surgical History:  Procedure Laterality Date   COLONOSCOPY     LUMBAR LAMINECTOMY/DECOMPRESSION MICRODISCECTOMY Right 04/25/2016   Procedure: Laminectomy for facet/synovial cyst - Lumbar four- Lumbar five - right;  Surgeon: Kary Kos, MD;  Location: [facility];  Service: Neurosurgery;  Laterality: Right;   stress cardiolite  10/04/2001       Home Medications    Prior to Admission medications   Medication Sig Start Date End Date Taking? Authorizing Provider  pantoprazole (PROTONIX) 40 MG tablet Take 1 tablet (40 mg total) by mouth daily. 10/06/21  Yes Meghen Akopyan K, PA-C  sucralfate (CARAFATE) 1 GM/10ML suspension Take 10 mLs (1 g total) by mouth 4 (four) times daily -  with meals and at bedtime. 10/06/21  Yes Jadarrius Maselli K, PA-C  amLODipine (NORVASC) 2.5 MG tablet Take 1 tablet (2.5 mg total) by mouth daily. 07/22/17   Renato Shin, MD  atorvastatin (LIPITOR) 10 MG tablet Take 10 mg by mouth daily. 09/01/19   [provider]  clotrimazole-betamethasone (LOTRISONE) cream Apply to affected area 3 times daily as  needed for rash. *Further refills should come from PCP.* Patient not taking: Reported on 02/06/2020 03/07/19   Renato Shin, MD  desonide (DESOWEN) 0.05 % ointment Apply 1 application topically 2 (two) times daily. 01/05/20   [provider]  hydrochlorothiazide (HYDRODIURIL) 25 MG tablet Take 25 mg by mouth daily. 01/25/20   [provider]  ibuprofen (ADVIL) 600 MG tablet Take 1 tablet  (600 mg total) by mouth every 8 (eight) hours as needed. With food. 02/06/20   Libby Maw, MD  LIPITOR 40 MG tablet TAKE 1 TABLET (40 MG TOTAL) BY MOUTH DAILY. Patient not taking: Reported on 02/06/2020 02/07/18   Renato Shin, MD  LOSARTAN POTASSIUM PO Take 100 capsules by mouth daily.    [provider]  losartan-hydrochlorothiazide (HYZAAR) 100-25 MG tablet TAKE 1 TABLET BY MOUTH EVERY DAY Patient not taking: Reported on 02/06/2020 03/19/17   Renato Shin, MD  methocarbamol (ROBAXIN) 500 MG tablet Take 1 tablet (500 mg total) by mouth every 8 (eight) hours as needed for muscle spasms. 02/06/20   Libby Maw, MD  metroNIDAZOLE (FLAGYL) 500 MG tablet Take 4 pills at once with food. No alcohol 24 hours prior and 24 hours after. 02/06/20   Libby Maw, MD    Family History Family History  Problem Relation Age of Onset   Cancer Mother        Ovarian Cancer   Ovarian cancer Mother    Hypertension Father    Alcohol abuse Maternal Aunt    Alcohol abuse Maternal Uncle    Alcohol abuse Cousin    Heart disease Neg Hx    Colon cancer Neg Hx    Colon polyps Neg Hx    Esophageal cancer Neg Hx    Rectal cancer Neg Hx    Stomach cancer Neg Hx     Social History Social History   Tobacco Use   Smoking status: Former    Types: Cigarettes    Quit date: 04/07/2005    Years since quitting: 16.5   Smokeless tobacco: Never  Vaping Use   Vaping Use: Never used  Substance Use Topics   Alcohol use: Not Currently    Alcohol/week: 0.0 standard drinks of alcohol   Drug use: Not Currently    Types: Cocaine    Comment: pt denies using any recreational or street drugs (04/18/2016)     Allergies   Lisinopril   Review of Systems Review of Systems  Constitutional:  Positive for activity change. Negative for appetite change, fatigue and fever.  Respiratory:  Negative for cough and shortness of breath.   Cardiovascular:  Negative for chest pain.   Gastrointestinal:  Positive for abdominal pain and nausea. Negative for constipation, diarrhea and vomiting.  Neurological:  Negative for dizziness, light-headedness and headaches.     Physical Exam Triage Vital Signs ED Triage Vitals  Enc Vitals Group     BP 10/06/21 1043 (!) 139/98     Pulse Rate 10/06/21 1043 82     Resp 10/06/21 1043 18     Temp 10/06/21 1043 98.4 F (36.9 C)     Temp src --      SpO2 10/06/21 1043 98 %     Weight --      Height --      Head Circumference --      Peak Flow --      Pain Score 10/06/21 1041 4     Pain Loc --      Pain  Edu? --      Excl. in [place]? --    No data found.  Updated Vital Signs BP (!) 139/98   Pulse 82   Temp 98.4 F (36.9 C)   Resp 18   SpO2 98%   Visual Acuity Right Eye Distance:   Left Eye Distance:   Bilateral Distance:    Right Eye Near:   Left Eye Near:    Bilateral Near:     Physical Exam Vitals reviewed.  Constitutional:      General: He is awake.     Appearance: Normal appearance. He is well-developed. He is not ill-appearing.     Comments: Very pleasant male appears stated age no acute distress  HENT:     Head: Normocephalic and atraumatic.     Mouth/Throat:     Mouth: Mucous membranes are moist.     Pharynx: Uvula midline. No oropharyngeal exudate or posterior oropharyngeal erythema.  Cardiovascular:     Rate and Rhythm: Normal rate and regular rhythm.     Heart sounds: Normal heart sounds, S1 normal and S2 normal. No murmur heard. Pulmonary:     Effort: Pulmonary effort is normal.     Breath sounds: Normal breath sounds. No stridor. No wheezing, rhonchi or rales.     Comments: Clear to auscultation bilaterally Abdominal:     General: Bowel sounds are normal.     Palpations: Abdomen is soft.     Tenderness: There is no abdominal tenderness. There is no right CVA tenderness, left CVA tenderness, guarding or rebound.     Comments: Benign abdominal exam; no tenderness palpation on exam.   Neurological:     Mental Status: He is alert.  Psychiatric:        Behavior: Behavior is cooperative.      UC Treatments / Results  Labs (all labs ordered are listed, but only abnormal results are displayed) Labs Reviewed - No data to display  EKG   Radiology No results found.  Procedures Procedures (including critical care time)  Medications Ordered in UC Medications  alum & mag hydroxide-simeth (MAALOX/MYLANTA) 200-200-20 MG/5ML suspension 30 mL (30 mLs Oral Given 10/06/21 1103)    And  lidocaine (XYLOCAINE) 2 % viscous mouth solution 15 mL (15 mLs Oral Given 10/06/21 1104)    Initial Impression / Assessment and Plan / UC Course  I have reviewed the triage vital signs and the nursing notes.  Pertinent labs & imaging results that were available during my care of the patient were reviewed by me and considered in my medical decision making (see chart for details).     Patient is well-appearing, afebrile, nontoxic, nontachycardic.  No indication for emergent evaluation or imaging based on exam today.  Patient was given GI cocktail with resolution of symptoms.  Discussed that symptoms are likely related to NSAID related gastritis.  Patient was encouraged to limit NSAID use and avoid alcohol.  We will start Carafate and Protonix.  He was encouraged to eat a bland diet.  Discussed that if he has any worsening symptoms including recurrent abdominal pain, melena, hematochezia, nausea/vomiting interfering with oral intake he needs to be seen immediately.  If his symptoms are not worsening but do not improve he should follow-up with GI and was given contact information for local provider.  Strict return precautions given.  Work excuse note provided.  Final Clinical Impressions(s) / UC Diagnoses   Final diagnoses:  Acute gastritis, presence of bleeding unspecified, unspecified gastritis type  Upper abdominal pain  Discharge Instructions      I am glad that you are feeling  better with the medication.  Please start Carafate 4 times daily.  I also recommend that you start Protonix.  Take this on an empty stomach (30 minutes before meal or 2 hours after).  Try to avoid Naprosyn and other NSAID use including aspirin, ibuprofen/Advil, naproxen/Aleve.  Also avoid alcohol.  If your symptoms or not improving I do recommend you follow-up with GI; call to schedule an appointment.  If anything worsens suddenly you need to go to the emergency room immediately.     ED Prescriptions     Medication Sig Dispense Auth. Provider   sucralfate (CARAFATE) 1 GM/10ML suspension Take 10 mLs (1 g total) by mouth 4 (four) times daily -  with meals and at bedtime. 420 mL Tonea Leiphart K, PA-C   pantoprazole (PROTONIX) 40 MG tablet Take 1 tablet (40 mg total) by mouth daily. 14 tablet Osten Janek, Derry Skill, PA-C      PDMP not reviewed this encounter.   Terrilee Croak, PA-C 10/06/21 1130

## 2021-10-06 NOTE — Discharge Instructions (Signed)
I am glad that you are feeling better with the medication.  Please start Carafate 4 times daily.  I also recommend that you start Protonix.  Take this on an empty stomach (30 minutes before meal or 2 hours after).  Try to avoid Naprosyn and other NSAID use including aspirin, ibuprofen/Advil, naproxen/Aleve.  Also avoid alcohol.  If your symptoms or not improving I do recommend you follow-up with GI; call to schedule an appointment.  If anything worsens suddenly you need to go to the emergency room immediately.

## 2021-10-06 NOTE — ED Triage Notes (Signed)
Pt reports ABD pain with nausea for 4 days. Pt reports he has decreased appetite .

## 2022-09-08 ENCOUNTER — Encounter: Payer: Self-pay | Admitting: Gastroenterology

## 2023-07-14 ENCOUNTER — Encounter: Payer: Self-pay | Admitting: Urology

## 2023-07-14 ENCOUNTER — Other Ambulatory Visit: Payer: Self-pay | Admitting: Urology

## 2023-07-14 DIAGNOSIS — R972 Elevated prostate specific antigen [PSA]: Secondary | ICD-10-CM

## 2023-09-02 ENCOUNTER — Ambulatory Visit
Admission: RE | Admit: 2023-09-02 | Discharge: 2023-09-02 | Disposition: A | Discharge status: Home / Self Care | Source: Ambulatory Visit | Attending: Urology

## 2023-09-02 DIAGNOSIS — R972 Elevated prostate specific antigen [PSA]: Secondary | ICD-10-CM

## 2023-09-02 MED ORDER — GADOPICLENOL 0.5 MMOL/ML IV SOLN
8.0000 mL | Freq: Once | INTRAVENOUS | Status: AC | PRN
Start: 2023-09-02 — End: 2023-09-02
  Administered 2023-09-02: 8 mL via INTRAVENOUS
